# Patient Record
Sex: Female | Born: 1945 | Race: White | Hispanic: No | Smoking: Never smoker
Health system: Southern US, Community
[De-identification: ages and names within clinical notes are randomized; demographics above are authoritative.]

## PROBLEM LIST (undated history)

## (undated) DIAGNOSIS — F329 Major depressive disorder, single episode, unspecified: Secondary | ICD-10-CM

## (undated) DIAGNOSIS — E7800 Pure hypercholesterolemia, unspecified: Secondary | ICD-10-CM

## (undated) DIAGNOSIS — C4492 Squamous cell carcinoma of skin, unspecified: Secondary | ICD-10-CM

## (undated) DIAGNOSIS — G35 Multiple sclerosis: Secondary | ICD-10-CM

## (undated) DIAGNOSIS — F419 Anxiety disorder, unspecified: Secondary | ICD-10-CM

## (undated) DIAGNOSIS — G35D Multiple sclerosis, unspecified: Secondary | ICD-10-CM

## (undated) DIAGNOSIS — G2581 Restless legs syndrome: Secondary | ICD-10-CM

## (undated) DIAGNOSIS — F32A Depression, unspecified: Secondary | ICD-10-CM

## (undated) DIAGNOSIS — M159 Polyosteoarthritis, unspecified: Secondary | ICD-10-CM

## (undated) DIAGNOSIS — Z8619 Personal history of other infectious and parasitic diseases: Secondary | ICD-10-CM

## (undated) DIAGNOSIS — G5603 Carpal tunnel syndrome, bilateral upper limbs: Secondary | ICD-10-CM

## (undated) DIAGNOSIS — J329 Chronic sinusitis, unspecified: Secondary | ICD-10-CM

## (undated) DIAGNOSIS — M6740 Ganglion, unspecified site: Secondary | ICD-10-CM

## (undated) DIAGNOSIS — Z1211 Encounter for screening for malignant neoplasm of colon: Secondary | ICD-10-CM

## (undated) DIAGNOSIS — Z85828 Personal history of other malignant neoplasm of skin: Secondary | ICD-10-CM

## (undated) DIAGNOSIS — M4802 Spinal stenosis, cervical region: Secondary | ICD-10-CM

## (undated) DIAGNOSIS — I10 Essential (primary) hypertension: Secondary | ICD-10-CM

## (undated) DIAGNOSIS — Z8489 Family history of other specified conditions: Secondary | ICD-10-CM

## (undated) DIAGNOSIS — C4491 Basal cell carcinoma of skin, unspecified: Secondary | ICD-10-CM

## (undated) DIAGNOSIS — M1990 Unspecified osteoarthritis, unspecified site: Secondary | ICD-10-CM

## (undated) DIAGNOSIS — IMO0001 Reserved for inherently not codable concepts without codable children: Secondary | ICD-10-CM

## (undated) HISTORY — PX: EYE SURGERY: SHX253

## (undated) HISTORY — DX: Personal history of other malignant neoplasm of skin: Z85.828

## (undated) HISTORY — DX: Personal history of other infectious and parasitic diseases: Z86.19

## (undated) HISTORY — DX: Depression, unspecified: F32.A

## (undated) HISTORY — DX: Spinal stenosis, cervical region: M48.02

## (undated) HISTORY — DX: Ganglion, unspecified site: M67.40

## (undated) HISTORY — PX: TUBAL LIGATION: SHX77

## (undated) HISTORY — DX: Pure hypercholesterolemia, unspecified: E78.00

## (undated) HISTORY — PX: CATARACT EXTRACTION: SUR2

## (undated) HISTORY — PX: COLONOSCOPY: SHX174

## (undated) HISTORY — PX: RETINAL DETACHMENT SURGERY: SHX105

## (undated) HISTORY — DX: Encounter for screening for malignant neoplasm of colon: Z12.11

## (undated) HISTORY — DX: Chronic sinusitis, unspecified: J32.9

## (undated) HISTORY — DX: Carpal tunnel syndrome, bilateral upper limbs: G56.03

## (undated) HISTORY — DX: Anxiety disorder, unspecified: F41.9

## (undated) HISTORY — DX: Polyosteoarthritis, unspecified: M15.9

## (undated) HISTORY — PX: NASAL SINUS SURGERY: SHX719

---
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
## 1998-12-29 ENCOUNTER — Other Ambulatory Visit: Admission: RE | Admit: 1998-12-29 | Discharge: 1998-12-29 | Payer: Self-pay | Admitting: Internal Medicine

## 1999-11-17 ENCOUNTER — Other Ambulatory Visit: Admission: RE | Admit: 1999-11-17 | Discharge: 1999-11-17 | Payer: Self-pay | Admitting: Internal Medicine

## 1999-12-14 ENCOUNTER — Encounter: Payer: Self-pay | Admitting: Internal Medicine

## 1999-12-14 ENCOUNTER — Encounter: Admission: RE | Admit: 1999-12-14 | Discharge: 1999-12-14 | Payer: Self-pay | Admitting: Internal Medicine

## 1999-12-18 ENCOUNTER — Encounter: Admission: RE | Admit: 1999-12-18 | Discharge: 1999-12-18 | Payer: Self-pay | Admitting: Internal Medicine

## 1999-12-18 ENCOUNTER — Encounter: Payer: Self-pay | Admitting: Internal Medicine

## 2000-07-27 DIAGNOSIS — C4492 Squamous cell carcinoma of skin, unspecified: Secondary | ICD-10-CM

## 2000-07-27 HISTORY — DX: Squamous cell carcinoma of skin, unspecified: C44.92

## 2000-12-13 ENCOUNTER — Encounter: Admission: RE | Admit: 2000-12-13 | Discharge: 2000-12-13 | Payer: Self-pay | Admitting: Obstetrics and Gynecology

## 2000-12-13 ENCOUNTER — Encounter: Payer: Self-pay | Admitting: Obstetrics and Gynecology

## 2001-12-28 ENCOUNTER — Encounter: Payer: Self-pay | Admitting: Obstetrics and Gynecology

## 2001-12-28 ENCOUNTER — Encounter: Admission: RE | Admit: 2001-12-28 | Discharge: 2001-12-28 | Payer: Self-pay | Admitting: Obstetrics and Gynecology

## 2002-12-06 ENCOUNTER — Encounter: Payer: Self-pay | Admitting: Internal Medicine

## 2002-12-06 ENCOUNTER — Encounter: Admission: RE | Admit: 2002-12-06 | Discharge: 2002-12-06 | Payer: Self-pay | Admitting: Internal Medicine

## 2003-01-01 ENCOUNTER — Encounter: Admission: RE | Admit: 2003-01-01 | Discharge: 2003-01-01 | Payer: Self-pay | Admitting: Obstetrics and Gynecology

## 2003-01-01 ENCOUNTER — Encounter: Payer: Self-pay | Admitting: Obstetrics and Gynecology

## 2003-12-10 ENCOUNTER — Other Ambulatory Visit: Admission: RE | Admit: 2003-12-10 | Discharge: 2003-12-10 | Payer: Self-pay | Admitting: Internal Medicine

## 2004-01-20 ENCOUNTER — Encounter: Admission: RE | Admit: 2004-01-20 | Discharge: 2004-01-20 | Payer: Self-pay | Admitting: Internal Medicine

## 2005-03-11 ENCOUNTER — Ambulatory Visit (HOSPITAL_COMMUNITY): Admission: RE | Admit: 2005-03-11 | Discharge: 2005-03-11 | Payer: Self-pay | Admitting: Psychiatry

## 2005-03-11 ENCOUNTER — Encounter: Admission: RE | Admit: 2005-03-11 | Discharge: 2005-03-11 | Payer: Self-pay | Admitting: Internal Medicine

## 2005-03-11 ENCOUNTER — Other Ambulatory Visit: Admission: RE | Admit: 2005-03-11 | Discharge: 2005-03-11 | Payer: Self-pay | Admitting: Internal Medicine

## 2006-01-06 ENCOUNTER — Encounter: Payer: Self-pay | Admitting: Gastroenterology

## 2006-01-06 ENCOUNTER — Ambulatory Visit: Payer: Self-pay | Admitting: Family Medicine

## 2006-02-22 ENCOUNTER — Ambulatory Visit: Payer: Self-pay | Admitting: Family Medicine

## 2006-10-12 ENCOUNTER — Encounter: Admission: RE | Admit: 2006-10-12 | Discharge: 2006-10-12 | Payer: Self-pay | Admitting: Family Medicine

## 2006-10-27 ENCOUNTER — Encounter: Admission: RE | Admit: 2006-10-27 | Discharge: 2006-10-27 | Payer: Self-pay | Admitting: Unknown Physician Specialty

## 2006-11-03 ENCOUNTER — Ambulatory Visit: Payer: Self-pay | Admitting: Family Medicine

## 2006-12-15 ENCOUNTER — Ambulatory Visit: Payer: Self-pay | Admitting: Family Medicine

## 2007-04-28 ENCOUNTER — Encounter: Admission: RE | Admit: 2007-04-28 | Discharge: 2007-04-28 | Payer: Self-pay | Admitting: Family Medicine

## 2008-04-08 DIAGNOSIS — C4491 Basal cell carcinoma of skin, unspecified: Secondary | ICD-10-CM

## 2008-04-08 HISTORY — DX: Basal cell carcinoma of skin, unspecified: C44.91

## 2008-05-13 ENCOUNTER — Encounter: Admission: RE | Admit: 2008-05-13 | Discharge: 2008-05-13 | Payer: Self-pay | Admitting: Unknown Physician Specialty

## 2009-06-03 ENCOUNTER — Encounter: Admission: RE | Admit: 2009-06-03 | Discharge: 2009-06-03 | Payer: Self-pay | Admitting: Unknown Physician Specialty

## 2010-05-26 ENCOUNTER — Encounter: Payer: Self-pay | Admitting: Gastroenterology

## 2010-05-28 ENCOUNTER — Encounter: Payer: Self-pay | Admitting: Gastroenterology

## 2010-06-10 ENCOUNTER — Encounter (INDEPENDENT_AMBULATORY_CARE_PROVIDER_SITE_OTHER): Payer: Self-pay | Admitting: *Deleted

## 2010-06-15 ENCOUNTER — Ambulatory Visit: Payer: Self-pay | Admitting: Gastroenterology

## 2010-06-22 ENCOUNTER — Telehealth (INDEPENDENT_AMBULATORY_CARE_PROVIDER_SITE_OTHER): Payer: Self-pay | Admitting: *Deleted

## 2010-06-29 ENCOUNTER — Ambulatory Visit: Payer: Self-pay | Admitting: Gastroenterology

## 2010-09-05 ENCOUNTER — Encounter: Payer: Self-pay | Admitting: Family Medicine

## 2010-09-06 ENCOUNTER — Encounter: Payer: Self-pay | Admitting: Otolaryngology

## 2010-09-15 NOTE — Letter (Signed)
[facility]: [facility]   Imported By: Lennie Odor 07/03/2010 16:07:54  _____________________________________________________________________  External Attachment:    Type:   Image     Comment:   External Document

## 2010-09-15 NOTE — Letter (Signed)
Summary: Patient Medical HX / [facility]  Patient Medical HX / [facility]   Imported By: Lennie Odor 07/03/2010 16:11:54  _____________________________________________________________________  External Attachment:    Type:   Image     Comment:   External Document

## 2010-09-15 NOTE — Letter (Signed)
Summary: Pre Visit Letter Revised  [facility]  [address] [place], [place] [address]   Phone: [phone]  Fax: [phone]        05/28/2010 MRN: [id number]  Tracey Snow [address] [place], [place]  [address]             Procedure Date:  11-14 2:30pm  Welcome to the Gastroenterology Division at [facility].    You are scheduled to see a nurse for your pre-procedure visit on 06-15-10 at 1:30pm on the 3rd floor at [facility], [address]  We ask that you try to arrive at our office 15 minutes prior to your appointment time to allow for check-in.  Please take a minute to review the attached form.  If you answer "Yes" to one or more of the questions on the first page, we ask that you call the person listed at your earliest opportunity.  If you answer "No" to all of the questions, please complete the rest of the form and bring it to your appointment.    Your nurse visit will consist of discussing your medical and surgical history, your immediate family medical history, and your medications.   If you are unable to list all of your medications on the form, please bring the medication bottles to your appointment and we will list them.  We will need to be aware of both prescribed and over the counter drugs.  We will need to know exact dosage information as well.    Please be prepared to read and sign documents such as consent forms, a financial agreement, and acknowledgement forms.  If necessary, and with your consent, a friend or relative is welcome to sit-in on the nurse visit with you.  Please bring your insurance card so that we may make a copy of it.  If your insurance requires a referral to see a specialist, please bring your referral form from your primary care physician.  No co-pay is required for this nurse visit.     If you cannot keep your appointment, please call [phone] to cancel or reschedule prior to your appointment date.  This  allows [place] the opportunity to schedule an appointment for another patient in need of care.    Thank you for choosing [facility] for your medical needs.  We appreciate the opportunity to care for you.  Please visit [place] at our website  to learn more about our practice.  Sincerely, The Gastroenterology Division

## 2010-09-15 NOTE — Progress Notes (Signed)
Summary: Prep meds called in to wrong RX  Phone Note Call from Patient Call back at Home Phone [phone]   Call For: Dr Jarold Motto [facility] [place] [facility] [address] in [place], [place] - its not in [place], [place]. So she does not have her prep meds yet!  Follow-up for Phone Call        [facility] [place], [place]. Pt notified. Follow-up by: Ezra Sites RN,  June 22, 2010 9:36 AM     Appended Document: Change pharmacy    Clinical Lists Changes

## 2010-09-15 NOTE — Procedures (Signed)
Summary: Colonoscopy  Patient: [name] Note: All result statuses are Final unless otherwise noted.  Tests: (1) Colonoscopy (COL)   COL Colonoscopy           DONE     [facility]     [address]     [place], [place]  [address]           COLONOSCOPY PROCEDURE REPORT           PATIENT:  Tracey, Snow  MR#:  [id number]     BIRTHDATE:  27-Jan-1946, 64 yrs. old  GENDER:  female     ENDOSCOPIST:  Vania Rea. Jarold Motto, MD, [facility]     REF. BY:  Joette Catching, M.D.     PROCEDURE DATE:  06/29/2010     PROCEDURE:  Average-risk screening colonoscopy     G0121     ASA CLASS:  Class II     INDICATIONS:  Routine Risk Screening     MEDICATIONS:   Fentanyl 100 mcg IV, Versed 10 mg IV, Benadryl 25     mg IV           DESCRIPTION OF PROCEDURE:   After the risks benefits and     alternatives of the procedure were thoroughly explained, informed     consent was obtained.  Digital rectal exam was performed and     revealed no abnormalities.   The LB CF-[id number] endoscope     was introduced through the anus and advanced to the cecum, which     was identified by both the appendix and ileocecal valve, limited     by a tortuous and redundant colon.    The quality of the prep was     excellent, using MoviPrep.  The instrument was then slowly     withdrawn as the colon was fully examined.     <<PROCEDUREIMAGES>>           FINDINGS:  No polyps or cancers were seen.  This was otherwise a     normal examination of the colon.   Retroflexed views in the rectum     revealed no abnormalities.    The scope was then withdrawn from     the patient and the procedure completed.           COMPLICATIONS:  None     ENDOSCOPIC IMPRESSION:     1) No polyps or cancers     2) Otherwise normal examination     RECOMMENDATIONS:     1) Continue current colorectal screening recommendations for     "routine risk" patients with a repeat colonoscopy in 10 years.     REPEAT EXAM:  No        ______________________________     Vania Rea. Jarold Motto, MD, [facility]           CC:           n.     eSIGNED:   Vania Rea. Troy Kanouse at 06/29/2010 03:19 PM           Annye Asa, [id number]  Note: An exclamation mark (!) indicates a result that was not dispersed into the flowsheet. Document Creation Date: 06/29/2010 3:20 PM _______________________________________________________________________  (1) Order result status: Final Collection or observation date-time: 06/29/2010 15:14 Requested date-time:  Receipt date-time:  Reported date-time:  Referring Physician:   Ordering Physician: Sheryn Bison [phone]) Specimen Source:  Source: Launa Grill Order Number: [id number] Lab site:   Appended Document: Colonoscopy  Clinical Lists Changes  Observations: Added new observation of COLONNXTDUE: 06/2020 (06/29/2010 16:21)

## 2010-09-15 NOTE — Letter (Signed)
Summary: [facility] Instructions  [facility]  [address] [place], [place] [address]   Phone: [phone]  Fax: [phone]       DEJIA EBRON    [birth year]    MRN: [id number]        Procedure Day Dorna Bloom:  Duanne Limerick  06/29/10     Arrival Time:  1:30PM     Procedure Time:  2:30PM     Location of Procedure:                    _X _  [facility] (4th Floor)  PREPARATION FOR COLONOSCOPY WITH MOVIPREP   Starting 5 days prior to your procedure 06/24/10 do not eat nuts, seeds, popcorn, corn, beans, peas,  salads, or any raw vegetables.  Do not take any fiber supplements (e.g. Metamucil, Citrucel, and Benefiber).  THE DAY BEFORE YOUR PROCEDURE         DATE: 06/28/10  DAY: SUNDAY  1.  Drink clear liquids the entire day-NO SOLID FOOD  2.  Do not drink anything colored red or purple.  Avoid juices with pulp.  No orange juice.  3.  Drink at least 64 oz. (8 glasses) of fluid/clear liquids during the day to prevent dehydration and help the prep work efficiently.  CLEAR LIQUIDS INCLUDE: Water Jello Ice Popsicles Tea (sugar ok, no milk/cream) Powdered fruit flavored drinks Coffee (sugar ok, no milk/cream) Gatorade Juice: apple, white grape, white cranberry  Lemonade Clear bullion, consomm, broth Carbonated beverages (any kind) Strained chicken noodle soup Hard Candy                             4.  In the morning, mix first dose of MoviPrep solution:    Empty 1 Pouch A and 1 Pouch B into the disposable container    Add lukewarm drinking water to the top line of the container. Mix to dissolve    Refrigerate (mixed solution should be used within 24 hrs)  5.  Begin drinking the prep at 5:00 p.m. The MoviPrep container is divided by 4 marks.   Every 15 minutes drink the solution down to the next mark (approximately 8 oz) until the full liter is complete.   6.  Follow completed prep with 16 oz of clear liquid of your choice (Nothing red or purple).   Continue to drink clear liquids until bedtime.  7.  Before going to bed, mix second dose of MoviPrep solution:    Empty 1 Pouch A and 1 Pouch B into the disposable container    Add lukewarm drinking water to the top line of the container. Mix to dissolve    Refrigerate  THE DAY OF YOUR PROCEDURE      DATE: 06/29/10  DAY: MONDAY  Beginning at 9:30AM (5 hours before procedure):         1. Every 15 minutes, drink the solution down to the next mark (approx 8 oz) until the full liter is complete.  2. Follow completed prep with 16 oz. of clear liquid of your choice.    3. You may drink clear liquids until 12:30PM (2 HOURS BEFORE PROCEDURE).   MEDICATION INSTRUCTIONS  Unless otherwise instructed, you should take regular prescription medications with a small sip of water   as early as possible the morning of your procedure.   Additional medication instructions:   Hold HCTZ the morning of procedure.  OTHER INSTRUCTIONS  You will need a responsible adult at least 64 years of age to accompany you and drive you home.   This person must remain in the waiting room during your procedure.  Wear loose fitting clothing that is easily removed.  Leave jewelry and other valuables at home.  However, you may wish to bring a book to read or  an iPod/MP3 player to listen to music as you wait for your procedure to start.  Remove all body piercing jewelry and leave at home.  Total time from sign-in until discharge is approximately 2-3 hours.  You should go home directly after your procedure and rest.  You can resume normal activities the  day after your procedure.  The day of your procedure you should not:   Drive   Make legal decisions   Operate machinery   Drink alcohol   Return to work  You will receive specific instructions about eating, activities and medications before you leave.    The above instructions have been reviewed and explained to me by   Wyona Almas  RN  June 15, 2010 2:06 PM     I fully understand and can verbalize these instructions _____________________________ Date _________

## 2010-09-15 NOTE — Miscellaneous (Signed)
Summary: [facility] Previsit/prep  Clinical Lists Changes  Medications: Added new medication of MOVIPREP 100 GM  SOLR (PEG-KCL-NACL-NASULF-NA ASC-C) As per prep instructions. - Signed Rx of MOVIPREP 100 GM  SOLR (PEG-KCL-NACL-NASULF-NA ASC-C) As per prep instructions.;  #1 x 0;  Signed;  Entered by: Wyona Almas RN;  Authorized by: Mardella Layman MD [facility];  Method used: [facility] to [facility] [address] [place], [place]  [address], Ph: [phone] or [phone], Fax: [phone] Allergies: Added new allergy or adverse reaction of SULFA Observations: Added new observation of NKA: F (06/15/2010 13:11)    Prescriptions: MOVIPREP 100 GM  SOLR (PEG-KCL-NACL-NASULF-NA ASC-C) As per prep instructions.  #1 x 0   Entered by:   Wyona Almas RN   Authorized by:   Mardella Layman MD [facility]   Signed by:   Wyona Almas RN on 06/15/2010   Method used:   [facility]ronically to        [id number] (retail)       [address] [place]       [place], [place]  [address]       Ph: [phone] or [phone]       Fax: [phone]   RxID:   [id number]

## 2013-05-07 ENCOUNTER — Other Ambulatory Visit: Payer: Self-pay

## 2013-05-07 DIAGNOSIS — Z1231 Encounter for screening mammogram for malignant neoplasm of breast: Secondary | ICD-10-CM

## 2013-05-29 ENCOUNTER — Ambulatory Visit
Admission: RE | Admit: 2013-05-29 | Discharge: 2013-05-29 | Disposition: A | Discharge status: Home / Self Care | Payer: Medicare Other | Source: Ambulatory Visit

## 2013-05-29 DIAGNOSIS — Z1231 Encounter for screening mammogram for malignant neoplasm of breast: Secondary | ICD-10-CM

## 2014-01-18 ENCOUNTER — Other Ambulatory Visit: Payer: Self-pay

## 2014-01-18 DIAGNOSIS — Z1231 Encounter for screening mammogram for malignant neoplasm of breast: Secondary | ICD-10-CM

## 2015-02-18 ENCOUNTER — Encounter: Payer: Self-pay | Admitting: Gastroenterology

## 2016-03-25 ENCOUNTER — Encounter (INDEPENDENT_AMBULATORY_CARE_PROVIDER_SITE_OTHER): Payer: Medicare HMO | Admitting: Ophthalmology

## 2016-03-25 DIAGNOSIS — H2513 Age-related nuclear cataract, bilateral: Secondary | ICD-10-CM | POA: Diagnosis not present

## 2016-03-25 DIAGNOSIS — I10 Essential (primary) hypertension: Secondary | ICD-10-CM | POA: Diagnosis not present

## 2016-03-25 DIAGNOSIS — H35342 Macular cyst, hole, or pseudohole, left eye: Secondary | ICD-10-CM

## 2016-03-25 DIAGNOSIS — H43813 Vitreous degeneration, bilateral: Secondary | ICD-10-CM | POA: Diagnosis not present

## 2016-03-25 DIAGNOSIS — H35033 Hypertensive retinopathy, bilateral: Secondary | ICD-10-CM

## 2016-03-26 NOTE — H&P (Signed)
Tracey Snow is an 70 y.o. female.   Chief Complaint:loss of vision over 6-12 months left eye HPI: Noted painless loss of vision left eye  No past medical history on file.  No past surgical history on file.  No family history on file. Social History:  has no tobacco, alcohol, and drug history on file.  Allergies:  Allergies  Allergen Reactions  . Sulfonamide Derivatives     REACTION: rash, difficulty swallowing    No prescriptions prior to admission.    Review of systems otherwise negative  There were no vitals taken for this visit.  Physical exam: Mental status: oriented x3. Eyes: See eye exam associated with this date of surgery in media tab.  Scanned in by scanning center Ears, Nose, Throat: within normal limits Neck: Within Normal limits General: within normal limits Chest: Within normal limits Breast: deferred Heart: Within normal limits Abdomen: Within normal limits GU: deferred Extremities: within normal limits Skin: within normal limits  Assessment/Plan Macular hole left eye Plan: To [facility] for Pars plana vitrectomy, laser, membrane peel, serum patch, gas injection left eye  Nasser Ku, Chrystie Nose 03/26/2016, 7:36 AM

## 2016-04-05 ENCOUNTER — Encounter (HOSPITAL_COMMUNITY): Payer: Self-pay | Admitting: *Deleted

## 2016-04-05 MED ORDER — CEFAZOLIN SODIUM-DEXTROSE 2-4 GM/100ML-% IV SOLN
2.0000 g | INTRAVENOUS | Status: AC
  Administered 2016-04-06: 2 g via INTRAVENOUS
  Filled 2016-04-05: qty 100

## 2016-04-05 NOTE — Progress Notes (Signed)
Spoke with pt for pre-op call. Pt denies cardiac history or chest pain. She states she will have exertional sob due to her MS.

## 2016-04-06 ENCOUNTER — Encounter (HOSPITAL_COMMUNITY)
Admission: RE | Disposition: A | Discharge status: Home / Self Care | Payer: Self-pay | Source: Ambulatory Visit | Attending: Ophthalmology

## 2016-04-06 ENCOUNTER — Ambulatory Visit (HOSPITAL_COMMUNITY)
Admission: RE | Admit: 2016-04-06 | Discharge: 2016-04-07 | Disposition: A | Discharge status: Home / Self Care | Payer: Medicare HMO | Source: Ambulatory Visit | Attending: Ophthalmology | Admitting: Ophthalmology

## 2016-04-06 ENCOUNTER — Ambulatory Visit (HOSPITAL_COMMUNITY): Payer: Medicare HMO | Admitting: Anesthesiology

## 2016-04-06 ENCOUNTER — Encounter (HOSPITAL_COMMUNITY): Payer: Self-pay | Admitting: Certified Registered Nurse Anesthetist

## 2016-04-06 DIAGNOSIS — F418 Other specified anxiety disorders: Secondary | ICD-10-CM | POA: Diagnosis not present

## 2016-04-06 DIAGNOSIS — I10 Essential (primary) hypertension: Secondary | ICD-10-CM | POA: Insufficient documentation

## 2016-04-06 DIAGNOSIS — Z79899 Other long term (current) drug therapy: Secondary | ICD-10-CM | POA: Insufficient documentation

## 2016-04-06 DIAGNOSIS — H35342 Macular cyst, hole, or pseudohole, left eye: Secondary | ICD-10-CM | POA: Diagnosis present

## 2016-04-06 DIAGNOSIS — G35 Multiple sclerosis: Secondary | ICD-10-CM | POA: Diagnosis not present

## 2016-04-06 DIAGNOSIS — Z79890 Hormone replacement therapy (postmenopausal): Secondary | ICD-10-CM | POA: Insufficient documentation

## 2016-04-06 HISTORY — PX: 25 GAUGE PARS PLANA VITRECTOMY WITH 20 GAUGE MVR PORT FOR MACULAR HOLE: SHX6096

## 2016-04-06 HISTORY — DX: Essential (primary) hypertension: I10

## 2016-04-06 HISTORY — DX: Family history of other specified conditions: Z84.89

## 2016-04-06 HISTORY — PX: MEMBRANE PEEL: SHX5967

## 2016-04-06 HISTORY — DX: Major depressive disorder, single episode, unspecified: F32.9

## 2016-04-06 HISTORY — DX: Unspecified osteoarthritis, unspecified site: M19.90

## 2016-04-06 HISTORY — DX: Pure hypercholesterolemia, unspecified: E78.00

## 2016-04-06 HISTORY — DX: Multiple sclerosis: G35

## 2016-04-06 HISTORY — PX: SERUM PATCH: SHX6091

## 2016-04-06 HISTORY — DX: Anxiety disorder, unspecified: F41.9

## 2016-04-06 HISTORY — DX: Multiple sclerosis, unspecified: G35.D

## 2016-04-06 HISTORY — DX: Depression, unspecified: F32.A

## 2016-04-06 HISTORY — DX: Reserved for inherently not codable concepts without codable children: IMO0001

## 2016-04-06 HISTORY — PX: GAS/FLUID EXCHANGE: SHX5334

## 2016-04-06 HISTORY — PX: PARS PLANA VITRECTOMY W/ REPAIR OF MACULAR HOLE: SHX2170

## 2016-04-06 HISTORY — DX: Macular cyst, hole, or pseudohole, left eye: H35.342

## 2016-04-06 HISTORY — PX: LASER PHOTO ABLATION: SHX5942

## 2016-04-06 HISTORY — DX: Restless legs syndrome: G25.81

## 2016-04-06 LAB — BASIC METABOLIC PANEL
ANION GAP: 6 (ref 5–15)
BUN: 12 mg/dL (ref 6–20)
CHLORIDE: 107 mmol/L (ref 101–111)
CO2: 26 mmol/L (ref 22–32)
Calcium: 9.2 mg/dL (ref 8.9–10.3)
Creatinine, Ser: 0.94 mg/dL (ref 0.44–1.00)
GFR calc non Af Amer: >60 mL/min (ref >60)
Glucose, Bld: 95 mg/dL (ref 65–99)
POTASSIUM: 3.4 mmol/L — AB (ref 3.5–5.1)
Sodium: 139 mmol/L (ref 135–145)

## 2016-04-06 LAB — CBC
HCT: 42.1 % (ref 36.0–46.0)
HEMOGLOBIN: 13.9 g/dL (ref 12.0–15.0)
MCH: 31.3 pg (ref 26.0–34.0)
MCHC: 33 g/dL (ref 30.0–36.0)
MCV: 94.8 fL (ref 78.0–100.0)
Platelets: 225 10*3/uL (ref 150–400)
RBC: 4.44 MIL/uL (ref 3.87–5.11)
RDW: 13 % (ref 11.5–15.5)
WBC: 5.8 10*3/uL (ref 4.0–10.5)

## 2016-04-06 LAB — AUTOLOGOUS SERUM PATCH PREP

## 2016-04-06 SURGERY — 25 GAUGE PARS PLANA VITRECTOMY WITH 20 GAUGE MVR PORT FOR MACULAR HOLE
Anesthesia: General | Site: Eye | Laterality: Left

## 2016-04-06 MED ORDER — BUPIVACAINE HCL (PF) 0.75 % IJ SOLN
INTRAMUSCULAR | Status: DC | PRN
  Administered 2016-04-06: 10 mL

## 2016-04-06 MED ORDER — ONDANSETRON HCL 4 MG/2ML IJ SOLN
4.0000 mg | Freq: Four times a day (QID) | INTRAMUSCULAR | Status: DC
  Administered 2016-04-06 – 2016-04-07 (×2): 4 mg via INTRAVENOUS
  Filled 2016-04-06 (×2): qty 2

## 2016-04-06 MED ORDER — PHENYLEPHRINE HCL 2.5 % OP SOLN
1.0000 [drp] | OPHTHALMIC | Status: AC
  Administered 2016-04-06 (×3): 1 [drp] via OPHTHALMIC
  Filled 2016-04-06: qty 2

## 2016-04-06 MED ORDER — ACETAMINOPHEN 325 MG PO TABS: 325.0000 mg | ORAL_TABLET | ORAL | Status: DC | PRN

## 2016-04-06 MED ORDER — BUPIVACAINE-EPINEPHRINE (PF) 0.25% -1:200000 IJ SOLN
INTRAMUSCULAR | Status: AC
  Filled 2016-04-06: qty 30

## 2016-04-06 MED ORDER — DEXAMETHASONE SODIUM PHOSPHATE 10 MG/ML IJ SOLN
INTRAMUSCULAR | Status: AC
  Filled 2016-04-06: qty 1

## 2016-04-06 MED ORDER — SODIUM HYALURONATE 10 MG/ML IO SOLN
INTRAOCULAR | Status: AC
  Filled 2016-04-06: qty 0.85

## 2016-04-06 MED ORDER — ROCURONIUM BROMIDE 100 MG/10ML IV SOLN
INTRAVENOUS | Status: DC | PRN
  Administered 2016-04-06: 40 mg via INTRAVENOUS

## 2016-04-06 MED ORDER — PROMETHAZINE HCL 25 MG/ML IJ SOLN: 6.2500 mg | INTRAMUSCULAR | Status: DC | PRN

## 2016-04-06 MED ORDER — TETRACAINE HCL 0.5 % OP SOLN
2.0000 [drp] | Freq: Once | OPHTHALMIC | Status: DC
  Filled 2016-04-06: qty 2

## 2016-04-06 MED ORDER — GATIFLOXACIN 0.5 % OP SOLN
1.0000 [drp] | Freq: Four times a day (QID) | OPHTHALMIC | Status: DC
  Filled 2016-04-06: qty 2.5

## 2016-04-06 MED ORDER — PREDNISOLONE ACETATE 1 % OP SUSP
1.0000 [drp] | Freq: Four times a day (QID) | OPHTHALMIC | Status: DC
  Filled 2016-04-06: qty 5

## 2016-04-06 MED ORDER — ROCURONIUM BROMIDE 10 MG/ML (PF) SYRINGE
PREFILLED_SYRINGE | INTRAVENOUS | Status: AC
  Filled 2016-04-06: qty 10

## 2016-04-06 MED ORDER — FENTANYL CITRATE (PF) 100 MCG/2ML IJ SOLN
INTRAMUSCULAR | Status: AC
  Filled 2016-04-06: qty 2

## 2016-04-06 MED ORDER — TRIAMCINOLONE ACETONIDE 40 MG/ML IJ SUSP
INTRAMUSCULAR | Status: AC
  Filled 2016-04-06: qty 5

## 2016-04-06 MED ORDER — BRIMONIDINE TARTRATE 0.2 % OP SOLN
1.0000 [drp] | Freq: Two times a day (BID) | OPHTHALMIC | Status: DC
  Filled 2016-04-06: qty 5

## 2016-04-06 MED ORDER — HYPROMELLOSE (GONIOSCOPIC) 2.5 % OP SOLN
OPHTHALMIC | Status: AC
  Filled 2016-04-06: qty 15

## 2016-04-06 MED ORDER — EPHEDRINE 5 MG/ML INJ
INTRAVENOUS | Status: AC
  Filled 2016-04-06: qty 10

## 2016-04-06 MED ORDER — PHENYLEPHRINE HCL 10 MG/ML IJ SOLN
INTRAMUSCULAR | Status: DC | PRN
  Administered 2016-04-06 (×2): 80 ug via INTRAVENOUS

## 2016-04-06 MED ORDER — HYALURONIDASE HUMAN 150 UNIT/ML IJ SOLN
INTRAMUSCULAR | Status: AC
  Filled 2016-04-06: qty 1

## 2016-04-06 MED ORDER — AMLODIPINE BESYLATE 5 MG PO TABS
5.0000 mg | ORAL_TABLET | Freq: Every day | ORAL | Status: DC
  Administered 2016-04-06: 5 mg via ORAL
  Filled 2016-04-06: qty 1

## 2016-04-06 MED ORDER — SODIUM HYALURONATE 10 MG/ML IO SOLN
INTRAOCULAR | Status: DC | PRN
  Administered 2016-04-06: 0.85 mL via INTRAOCULAR

## 2016-04-06 MED ORDER — TROPICAMIDE 1 % OP SOLN
1.0000 [drp] | OPHTHALMIC | Status: AC
  Administered 2016-04-06 (×3): 1 [drp] via OPHTHALMIC
  Filled 2016-04-06: qty 3

## 2016-04-06 MED ORDER — SUGAMMADEX SODIUM 200 MG/2ML IV SOLN
INTRAVENOUS | Status: DC | PRN
  Administered 2016-04-06: 200 mg via INTRAVENOUS

## 2016-04-06 MED ORDER — DEXAMETHASONE SODIUM PHOSPHATE 4 MG/ML IJ SOLN
INTRAMUSCULAR | Status: DC | PRN
  Administered 2016-04-06: 10 mg via INTRAVENOUS

## 2016-04-06 MED ORDER — BSS PLUS IO SOLN
INTRAOCULAR | Status: DC | PRN
  Administered 2016-04-06: 1

## 2016-04-06 MED ORDER — MAGNESIUM HYDROXIDE 400 MG/5ML PO SUSP
15.0000 mL | Freq: Four times a day (QID) | ORAL | Status: DC | PRN
Start: 2016-04-06 — End: 2016-04-07

## 2016-04-06 MED ORDER — ACETAZOLAMIDE SODIUM 500 MG IJ SOLR
500.0000 mg | Freq: Once | INTRAMUSCULAR | Status: AC
  Administered 2016-04-07: 500 mg via INTRAVENOUS
  Filled 2016-04-06: qty 500

## 2016-04-06 MED ORDER — TEMAZEPAM 15 MG PO CAPS: 15.0000 mg | ORAL_CAPSULE | Freq: Every evening | ORAL | Status: DC | PRN

## 2016-04-06 MED ORDER — BACITRACIN-POLYMYXIN B 500-10000 UNIT/GM OP OINT
TOPICAL_OINTMENT | OPHTHALMIC | Status: DC | PRN
  Administered 2016-04-06: 1 via OPHTHALMIC

## 2016-04-06 MED ORDER — FENTANYL CITRATE (PF) 100 MCG/2ML IJ SOLN
INTRAMUSCULAR | Status: AC
  Filled 2016-04-06: qty 4

## 2016-04-06 MED ORDER — CYCLOPENTOLATE HCL 1 % OP SOLN
1.0000 [drp] | OPHTHALMIC | Status: AC
  Administered 2016-04-06 (×3): 1 [drp] via OPHTHALMIC
  Filled 2016-04-06: qty 2

## 2016-04-06 MED ORDER — ROCURONIUM BROMIDE 10 MG/ML (PF) SYRINGE
PREFILLED_SYRINGE | INTRAVENOUS | Status: AC
Start: 2016-04-06 — End: 2016-04-06
  Filled 2016-04-06: qty 10

## 2016-04-06 MED ORDER — MIDAZOLAM HCL 2 MG/2ML IJ SOLN
INTRAMUSCULAR | Status: AC
  Filled 2016-04-06: qty 2

## 2016-04-06 MED ORDER — ONDANSETRON HCL 4 MG/2ML IJ SOLN
INTRAMUSCULAR | Status: AC
  Filled 2016-04-06: qty 2

## 2016-04-06 MED ORDER — BACITRACIN-POLYMYXIN B 500-10000 UNIT/GM OP OINT
1.0000 "application " | TOPICAL_OINTMENT | Freq: Three times a day (TID) | OPHTHALMIC | Status: DC
  Filled 2016-04-06: qty 3.5

## 2016-04-06 MED ORDER — POLYMYXIN B SULFATE 500000 UNITS IJ SOLR
INTRAMUSCULAR | Status: AC
  Filled 2016-04-06: qty 1

## 2016-04-06 MED ORDER — ACETAMINOPHEN 325 MG PO TABS
650.0000 mg | ORAL_TABLET | Freq: Four times a day (QID) | ORAL | Status: DC | PRN
Start: 2016-04-06 — End: 2016-04-06

## 2016-04-06 MED ORDER — SODIUM CHLORIDE 0.9 % IV SOLN
INTRAVENOUS | Status: DC
  Administered 2016-04-06 (×3): via INTRAVENOUS

## 2016-04-06 MED ORDER — HYDROCODONE-ACETAMINOPHEN 7.5-325 MG PO TABS: 1.0000 | ORAL_TABLET | Freq: Once | ORAL | Status: DC | PRN

## 2016-04-06 MED ORDER — MEDROXYPROGESTERONE ACETATE 2.5 MG PO TABS
2.5000 mg | ORAL_TABLET | Freq: Every day | ORAL | Status: DC
  Filled 2016-04-06: qty 1

## 2016-04-06 MED ORDER — MORPHINE SULFATE (PF) 2 MG/ML IV SOLN: 1.0000 mg | INTRAVENOUS | Status: DC | PRN

## 2016-04-06 MED ORDER — ESTRADIOL 1 MG PO TABS
1.0000 mg | ORAL_TABLET | Freq: Every day | ORAL | Status: DC
Start: 2016-04-07 — End: 2016-04-07

## 2016-04-06 MED ORDER — LIDOCAINE 2% (20 MG/ML) 5 ML SYRINGE
INTRAMUSCULAR | Status: AC
  Filled 2016-04-06: qty 5

## 2016-04-06 MED ORDER — LISINOPRIL 20 MG PO TABS
20.0000 mg | ORAL_TABLET | Freq: Every day | ORAL | Status: DC
  Administered 2016-04-06: 20 mg via ORAL
  Filled 2016-04-06: qty 1

## 2016-04-06 MED ORDER — LATANOPROST 0.005 % OP SOLN
1.0000 [drp] | Freq: Every day | OPHTHALMIC | Status: DC
  Filled 2016-04-06: qty 2.5

## 2016-04-06 MED ORDER — CEFTAZIDIME 1 G IJ SOLR
INTRAMUSCULAR | Status: AC
  Filled 2016-04-06: qty 1

## 2016-04-06 MED ORDER — TIZANIDINE HCL 4 MG PO TABS
4.0000 mg | ORAL_TABLET | Freq: Every day | ORAL | Status: DC
  Filled 2016-04-06: qty 1
  Filled 2016-04-06: qty 2

## 2016-04-06 MED ORDER — ALPRAZOLAM 0.5 MG PO TABS
1.0000 mg | ORAL_TABLET | Freq: Every evening | ORAL | Status: DC | PRN
  Administered 2016-04-07: 1 mg via ORAL
  Filled 2016-04-06: qty 2

## 2016-04-06 MED ORDER — BSS IO SOLN
INTRAOCULAR | Status: AC
  Filled 2016-04-06: qty 15

## 2016-04-06 MED ORDER — ATROPINE SULFATE 1 % OP SOLN
OPHTHALMIC | Status: DC | PRN
  Administered 2016-04-06: 1 [drp] via OPHTHALMIC

## 2016-04-06 MED ORDER — LIDOCAINE HCL (CARDIAC) 20 MG/ML IV SOLN
INTRAVENOUS | Status: DC | PRN
  Administered 2016-04-06: 100 mg via INTRAVENOUS

## 2016-04-06 MED ORDER — BUPIVACAINE HCL (PF) 0.75 % IJ SOLN
INTRAMUSCULAR | Status: AC
  Filled 2016-04-06: qty 10

## 2016-04-06 MED ORDER — FENTANYL CITRATE (PF) 100 MCG/2ML IJ SOLN
INTRAMUSCULAR | Status: DC | PRN
  Administered 2016-04-06 (×2): 50 ug via INTRAVENOUS

## 2016-04-06 MED ORDER — LIDOCAINE HCL 2 % IJ SOLN
INTRAMUSCULAR | Status: AC
  Filled 2016-04-06: qty 20

## 2016-04-06 MED ORDER — EPINEPHRINE HCL 1 MG/ML IJ SOLN
INTRAMUSCULAR | Status: AC
  Filled 2016-04-06: qty 1

## 2016-04-06 MED ORDER — CITALOPRAM HYDROBROMIDE 20 MG PO TABS: 20.0000 mg | ORAL_TABLET | Freq: Every day | ORAL | Status: DC

## 2016-04-06 MED ORDER — BSS PLUS IO SOLN
INTRAOCULAR | Status: AC
  Filled 2016-04-06: qty 500

## 2016-04-06 MED ORDER — GATIFLOXACIN 0.5 % OP SOLN
1.0000 [drp] | OPHTHALMIC | Status: AC
  Administered 2016-04-06 (×3): 1 [drp] via OPHTHALMIC
  Filled 2016-04-06: qty 2.5

## 2016-04-06 MED ORDER — BSS IO SOLN
INTRAOCULAR | Status: DC | PRN
  Administered 2016-04-06: 15 mL via INTRAOCULAR

## 2016-04-06 MED ORDER — EPHEDRINE SULFATE 50 MG/ML IJ SOLN
INTRAMUSCULAR | Status: DC | PRN
  Administered 2016-04-06 (×2): 10 mg via INTRAVENOUS

## 2016-04-06 MED ORDER — BACITRACIN-POLYMYXIN B 500-10000 UNIT/GM OP OINT
TOPICAL_OINTMENT | OPHTHALMIC | Status: AC
  Filled 2016-04-06: qty 3.5

## 2016-04-06 MED ORDER — ROSUVASTATIN CALCIUM 20 MG PO TABS
20.0000 mg | ORAL_TABLET | Freq: Every day | ORAL | Status: DC
  Administered 2016-04-06: 20 mg via ORAL
  Filled 2016-04-06: qty 1

## 2016-04-06 MED ORDER — ONDANSETRON HCL 4 MG/2ML IJ SOLN
INTRAMUSCULAR | Status: DC | PRN
  Administered 2016-04-06: 4 mg via INTRAVENOUS

## 2016-04-06 MED ORDER — ALPRAZOLAM 0.5 MG PO TABS
1.0000 mg | ORAL_TABLET | Freq: Two times a day (BID) | ORAL | Status: DC
  Administered 2016-04-06: 1 mg via ORAL
  Filled 2016-04-06: qty 2

## 2016-04-06 MED ORDER — EPINEPHRINE HCL 1 MG/ML IJ SOLN
INTRAMUSCULAR | Status: DC | PRN
  Administered 2016-04-06: .3 mL

## 2016-04-06 MED ORDER — DEXAMETHASONE SODIUM PHOSPHATE 10 MG/ML IJ SOLN
INTRAMUSCULAR | Status: DC | PRN
  Administered 2016-04-06: 10 mg

## 2016-04-06 MED ORDER — HYDROCODONE-ACETAMINOPHEN 5-325 MG PO TABS: 1.0000 | ORAL_TABLET | ORAL | Status: DC | PRN

## 2016-04-06 MED ORDER — STERILE WATER FOR INJECTION IJ SOLN
INTRAMUSCULAR | Status: DC | PRN
  Administered 2016-04-06: 20 mL

## 2016-04-06 MED ORDER — STERILE WATER FOR INJECTION IJ SOLN
INTRAMUSCULAR | Status: AC
  Filled 2016-04-06: qty 20

## 2016-04-06 MED ORDER — PROPOFOL 10 MG/ML IV BOLUS
INTRAVENOUS | Status: AC
  Filled 2016-04-06: qty 20

## 2016-04-06 MED ORDER — PROPOFOL 10 MG/ML IV BOLUS
INTRAVENOUS | Status: DC | PRN
  Administered 2016-04-06: 30 mg via INTRAVENOUS
  Administered 2016-04-06: 120 mg via INTRAVENOUS

## 2016-04-06 MED ORDER — POTASSIUM CHLORIDE 20 MEQ PO PACK
40.0000 meq | PACK | Freq: Every day | ORAL | Status: DC
  Administered 2016-04-06: 40 meq via ORAL
  Filled 2016-04-06 (×2): qty 2

## 2016-04-06 MED ORDER — DORZOLAMIDE HCL 2 % OP SOLN
1.0000 [drp] | Freq: Three times a day (TID) | OPHTHALMIC | Status: DC
  Filled 2016-04-06: qty 10

## 2016-04-06 MED ORDER — ATROPINE SULFATE 1 % OP SOLN
OPHTHALMIC | Status: AC
  Filled 2016-04-06: qty 5

## 2016-04-06 MED ORDER — ACETAZOLAMIDE SODIUM 500 MG IJ SOLR
INTRAMUSCULAR | Status: AC
  Filled 2016-04-06: qty 500

## 2016-04-06 MED ORDER — HYDROCHLOROTHIAZIDE 25 MG PO TABS
12.5000 mg | ORAL_TABLET | Freq: Every day | ORAL | Status: DC
  Administered 2016-04-06: 12.5 mg via ORAL
  Filled 2016-04-06: qty 1

## 2016-04-06 MED ORDER — MIDAZOLAM HCL 5 MG/5ML IJ SOLN
INTRAMUSCULAR | Status: DC | PRN
  Administered 2016-04-06: 2 mg via INTRAVENOUS

## 2016-04-06 MED ORDER — FENTANYL CITRATE (PF) 100 MCG/2ML IJ SOLN
25.0000 ug | INTRAMUSCULAR | Status: DC | PRN
  Administered 2016-04-06: 25 ug via INTRAVENOUS

## 2016-04-06 MED ORDER — SODIUM CHLORIDE 0.45 % IV SOLN
INTRAVENOUS | Status: DC
  Administered 2016-04-06: 17:00:00 via INTRAVENOUS

## 2016-04-06 MED ORDER — SODIUM CHLORIDE 0.9 % IJ SOLN
INTRAMUSCULAR | Status: AC
  Filled 2016-04-06: qty 10

## 2016-04-06 SURGICAL SUPPLY — 59 items
BLADE EYE CATARACT 19 1.4 BEAV (BLADE) IMPLANT
BLADE MVR KNIFE 19G (BLADE) IMPLANT
BLADE MVR KNIFE 20G (BLADE) ×2 IMPLANT
CANNULA VLV SOFT TIP 25GA (OPHTHALMIC) ×4 IMPLANT
CORDS BIPOLAR (ELECTRODE) ×2 IMPLANT
COTTONBALL LRG STERILE PKG (GAUZE/BANDAGES/DRESSINGS) ×6 IMPLANT
COVER MAYO STAND STRL (DRAPES) IMPLANT
DRAPE INCISE 51X51 W/FILM STRL (DRAPES) IMPLANT
DRAPE OPHTHALMIC 77X100 STRL (CUSTOM PROCEDURE TRAY) ×2 IMPLANT
ERASER HMR WETFIELD 23G BP (MISCELLANEOUS) ×2 IMPLANT
FILTER BLUE MILLIPORE (MISCELLANEOUS) IMPLANT
FILTER STRAW FLUID ASPIR (MISCELLANEOUS) ×2 IMPLANT
FORCEPS GRIESHABER ILM 25G A (INSTRUMENTS) IMPLANT
GAS AUTO FILL CONSTEL (OPHTHALMIC) ×4
GAS AUTO FILL CONSTELLATION (OPHTHALMIC) ×2 IMPLANT
GLOVE SS BIOGEL STRL SZ 6.5 (GLOVE) ×1 IMPLANT
GLOVE SS BIOGEL STRL SZ 7 (GLOVE) ×1 IMPLANT
GLOVE SUPERSENSE BIOGEL SZ 6.5 (GLOVE) ×1
GLOVE SUPERSENSE BIOGEL SZ 7 (GLOVE) ×1
GLOVE SURG 8.5 LATEX PF (GLOVE) ×2 IMPLANT
GOWN STRL REUS W/ TWL LRG LVL3 (GOWN DISPOSABLE) ×3 IMPLANT
GOWN STRL REUS W/TWL LRG LVL3 (GOWN DISPOSABLE) ×3
HANDLE PNEUMATIC FOR CONSTEL (OPHTHALMIC) IMPLANT
KIT BASIN OR (CUSTOM PROCEDURE TRAY) ×2 IMPLANT
KNIFE GRIESHABER SHARP 2.5MM (MISCELLANEOUS) IMPLANT
MICROPICK 25G (MISCELLANEOUS)
NEEDLE 18GX1X1/2 (RX/OR ONLY) (NEEDLE) ×2 IMPLANT
NEEDLE 25GX 5/8IN NON SAFETY (NEEDLE) ×2 IMPLANT
NEEDLE FILTER BLUNT 18X 1/2SAF (NEEDLE)
NEEDLE FILTER BLUNT 18X1 1/2 (NEEDLE) IMPLANT
NEEDLE HYPO 30X.5 LL (NEEDLE) IMPLANT
NS IRRIG 1000ML POUR BTL (IV SOLUTION) ×2 IMPLANT
PACK FRAGMATOME (OPHTHALMIC) IMPLANT
PACK VITRECTOMY CUSTOM (CUSTOM PROCEDURE TRAY) ×2 IMPLANT
PAD ARMBOARD 7.5X6 YLW CONV (MISCELLANEOUS) ×4 IMPLANT
PAK PIK VITRECTOMY CVS 25GA (OPHTHALMIC) ×2 IMPLANT
PIC ILLUMINATED 25G (OPHTHALMIC) ×2
PICK MICROPICK 25G (MISCELLANEOUS) IMPLANT
PIK ILLUMINATED 25G (OPHTHALMIC) ×1 IMPLANT
PROBE LASER ILLUM FLEX CVD 25G (OPHTHALMIC) IMPLANT
REPL STRA BRUSH NEEDLE (NEEDLE) ×4 IMPLANT
RESERVOIR BACK FLUSH (MISCELLANEOUS) ×4 IMPLANT
ROLLS DENTAL (MISCELLANEOUS) ×4 IMPLANT
SCRAPER DIAMOND 25GA (OPHTHALMIC RELATED) IMPLANT
SCRAPER DIAMOND DUST MEMBRANE (MISCELLANEOUS) ×2 IMPLANT
SPONGE SURGIFOAM ABS GEL 12-7 (HEMOSTASIS) ×2 IMPLANT
STOPCOCK 4 WAY LG BORE MALE ST (IV SETS) IMPLANT
SUT CHROMIC 7 0 TG140 8 (SUTURE) IMPLANT
SUT ETHILON 9 0 TG140 8 (SUTURE) ×2 IMPLANT
SUT POLY NON ABSORB 10-0 8 STR (SUTURE) IMPLANT
SUT SILK 4 0 RB 1 (SUTURE) IMPLANT
SYR 20CC LL (SYRINGE) ×2 IMPLANT
SYR 5ML LL (SYRINGE) IMPLANT
SYR BULB 3OZ (MISCELLANEOUS) ×2 IMPLANT
SYR TB 1ML LUER SLIP (SYRINGE) ×2 IMPLANT
SYRINGE 10CC LL (SYRINGE) IMPLANT
TUBING HIGH PRESS EXTEN 6IN (TUBING) IMPLANT
WATER STERILE IRR 1000ML POUR (IV SOLUTION) ×2 IMPLANT
WIPE INSTRUMENT VISIWIPE 73X73 (MISCELLANEOUS) IMPLANT

## 2016-04-06 NOTE — Anesthesia Preprocedure Evaluation (Addendum)
Anesthesia Evaluation  Patient identified by MRN, date of birth, ID band Patient awake    Reviewed: Allergy & Precautions, NPO status , Patient's Chart, lab work & pertinent test results  Airway Mallampati: II  TM Distance: >3 FB Neck ROM: Full    Dental  (+) Dental Advisory Given, Teeth Intact,    Pulmonary neg pulmonary ROS,    breath sounds clear to auscultation       Cardiovascular hypertension, Pt. on medications  Rhythm:Regular Rate:Normal     Neuro/Psych Anxiety Depression Multiple sclerosis    GI/Hepatic negative GI ROS, Neg liver ROS,   Endo/Other  negative endocrine ROS  Renal/GU negative Renal ROS     Musculoskeletal  (+) Arthritis ,   Abdominal   Peds  Hematology negative hematology ROS (+)   Anesthesia Other Findings   Reproductive/Obstetrics                          No results found for: WBC, HGB, HCT, MCV, PLT No results found for: CREATININE, BUN, NA, K, CL, CO2  Anesthesia Physical Anesthesia Plan  ASA: II  Anesthesia Plan: General   Post-op Pain Management:    Induction: Intravenous  Airway Management Planned: Oral ETT  Additional Equipment:   Intra-op Plan:   Post-operative Plan: Extubation in OR  Informed Consent: I have reviewed the patients History and Physical, chart, labs and discussed the procedure including the risks, benefits and alternatives for the proposed anesthesia with the patient or authorized representative who has indicated his/her understanding and acceptance.   Dental advisory given  Plan Discussed with: CRNA  Anesthesia Plan Comments:         Anesthesia Quick Evaluation

## 2016-04-06 NOTE — Anesthesia Procedure Notes (Signed)
Procedure Name: Intubation Date/Time: 04/06/2016 11:49 AM Performed by: Ollen Bowl Pre-anesthesia Checklist: Patient identified, Emergency Drugs available, Patient being monitored, Timeout performed and Suction available Patient Re-evaluated:Patient Re-evaluated prior to inductionOxygen Delivery Method: Circle system utilized and Simple face mask Preoxygenation: Pre-oxygenation with 100% oxygen Intubation Type: IV induction Ventilation: Mask ventilation without difficulty Laryngoscope Size: Miller and 2 Grade View: Grade I Tube type: Oral Tube size: 7.5 mm Number of attempts: 1 Airway Equipment and Method: Patient positioned with wedge pillow and Stylet Placement Confirmation: ETT inserted through vocal cords under direct vision,  positive ETCO2 and breath sounds checked- equal and bilateral Secured at: 21 cm Tube secured with: Tape Dental Injury: Teeth and Oropharynx as per pre-operative assessment

## 2016-04-06 NOTE — H&P (Signed)
I examined the patient today and there is no change in the medical status 

## 2016-04-06 NOTE — Brief Op Note (Signed)
Brief Operative note   Preoperative diagnosis:  macular hole left eye Postoperative diagnosis  Macular hole left eye   Procedures: repair macular hole with pars plana vitrectomy, membrane peel, serum patch, laser, gas injection left eye  Surgeon:  Hayden Pedro, MD...  Assistant:  Deatra Ina SA    Anesthesia: General  Specimen: none  Estimated blood loss:  1cc  Complications: none  Patient sent to PACU in good condition  Composed by Hayden Pedro MD  Dictation number: [id number]

## 2016-04-06 NOTE — Transfer of Care (Signed)
Immediate Anesthesia Transfer of Care Note  Patient: Tracey Snow  Procedure(s) Performed: Procedure(s): 25 GAUGE PARS PLANA VITRECTOMY WITH 20 GAUGE MVR PORT FOR MACULAR HOLE (Left) LASER PHOTO ABLATION (Left) SERUM PATCH (Left) MEMBRANE PEEL (Left) GAS/FLUID EXCHANGE (Left)  Patient Location: PACU  Anesthesia Type:General  Level of Consciousness: awake, alert  and oriented  Airway & Oxygen Therapy: Patient Spontanous Breathing and Patient connected to nasal cannula oxygen  Post-op Assessment: Report given to RN and Post -op Vital signs reviewed and stable  Post vital signs: Reviewed and stable  Last Vitals:  Vitals:   04/06/16 0930  BP: (!) 184/81  Pulse: 67  Resp: 18  Temp: 36.9 C    Last Pain:  Vitals:   04/06/16 0930  TempSrc: Oral         Complications: No apparent anesthesia complications

## 2016-04-06 NOTE — Op Note (Signed)
Tracey Snow, Tracey Snow            ACCOUNT NO.:  [id number]  MEDICAL RECORD NO.:  [id number]  LOCATION:  [facility]                         FACILITY:  [facility]  PHYSICIAN:  Chrystie Nose. Zigmund Daniel, M.D. DATE OF BIRTH:  15-Feb-1946  DATE OF PROCEDURE:  04/06/2016 DATE OF DISCHARGE:                              OPERATIVE REPORT   ADMISSION DIAGNOSIS:  Macular hole, left eye.  PROCEDURE:  Repair of macular hole with pars plana vitrectomy, retinal photocoagulation, gas fluid exchange, membrane peel, serum patch all in the left eye.  SURGEON:  Chrystie Nose. Zigmund Daniel, MD  ASSISTANT:  Deatra Ina, SA  ANESTHESIA:  General.  DETAILS:  Usual prep and drape, 25-gauge trocars placed at 4 and 10 respectively.  A 3 layered MVR incision was made through the sclera with diamond knife at 2 o'clock.  The conjunctiva was trimmed with Westcott scissors.  Diathermy was used for hemostasis.  The contact lens ring was anchored into place at 6 and 12 o'clock.  The indirect ophthalmoscope laser was moved into place, 473 burns were placed around the retinal periphery.  The power was 500 mW, 1000 microns each, and 0.1 seconds each.  Then the contact lens ring was anchored into place at 6 and 12 o'clock.  Provisc placed on the corneal surface and the flat contact lens was placed.  Pars plana vitrectomy was begun with the lighted pick and the vitreous cutter just behind the cataractous lens.  Membranes were encountered and carefully removed under low suction and rapid cutting.  The core vitrectomy was carried down to the macular surface. The cutter was drawn through the macular region removing some viscosity of vitreous.  Vitrectomy was carried into the mid periphery where additional strands of vitreous were removed from their attachments to the mid periphery.  The silicone tip suction line was brought in the eye and was drawn down toward the macular hole.  The fish strike sign occurred and vitreous posterior hyaloid jumped  to the silicone tip suction line.  The lighted pick was used to engage and peel the posterior hyaloid.  Vitreous cutter was repositioned in the eye.  These portions of vitreous were carefully removed under low suction and rapid cutting.  The vitrectomy was carried into the far periphery and the 30 degree prismatic contact lens was placed.  The vitrectomy was carried for 360 degrees in the far periphery.  The magnifying contact lens was then placed onto a layer of methylcellulose and onto the cornea.  The macular hole was inspected.  The diamond-dusted membrane scraper was used to engage the external limiting membrane against the internal limiting membrane from around the edge of the macular hole.  The ILM was removed for approximately 1 disc diameter around the macular hole.  The edges of the hole were found to be mobile, at this point, they were pushed toward the center of the hole.  A total gas fluid exchange was carried out at this point, additional time was allowed for fluid to track down the walls of the eye and collecting the posterior segment. The serum patch was repaired during this time, and the C3F8 was prepared into a 14% concentration for intravitreal injection.  Serum patch was delivered.  Additional fluid and serum was removed from the posterior segment until the retina was dry.  C3F8 14% was exchanged for intravitreal gas.  The scleral wound was closed with 1 interrupted 9-0 nylon suture.  The conjunctiva was closed with wet-field cautery.  The instruments and trocars were removed from the eye.  The wounds were tested and found to be secure.  Polymyxin and ceftazidime were rinsed around the globe for antibiotic coverage.  Decadron 10 mg was injected into the lower subconjunctival space.  Marcaine was injected around the globe for postop pain.  Atropine solution was applied. Closing pressure was 10 with a Barraquer tonometer.  Polysporin ophthalmic ointment and patch and  shield were placed.  The patient was awakened and taken to recovery in satisfactory condition.     Chrystie Nose. Zigmund Daniel, M.D.     JDM/MEDQ  D:  04/06/2016  T:  04/06/2016  Job:  [id number]

## 2016-04-07 ENCOUNTER — Encounter (HOSPITAL_COMMUNITY): Payer: Self-pay | Admitting: Ophthalmology

## 2016-04-07 DIAGNOSIS — H35342 Macular cyst, hole, or pseudohole, left eye: Secondary | ICD-10-CM | POA: Diagnosis not present

## 2016-04-07 MED ORDER — DORZOLAMIDE HCL 2 % OP SOLN
1.0000 [drp] | Freq: Three times a day (TID) | OPHTHALMIC | 12 refills | Status: DC
Start: 2016-04-07 — End: 2022-07-15

## 2016-04-07 MED ORDER — GATIFLOXACIN 0.5 % OP SOLN: 1.0000 [drp] | Freq: Four times a day (QID) | OPHTHALMIC | Status: DC

## 2016-04-07 MED ORDER — PREDNISOLONE ACETATE 1 % OP SUSP: 1.0000 [drp] | Freq: Four times a day (QID) | OPHTHALMIC | 0 refills | Status: DC

## 2016-04-07 MED ORDER — BRIMONIDINE TARTRATE 0.2 % OP SOLN: 1.0000 [drp] | Freq: Two times a day (BID) | OPHTHALMIC | 12 refills | Status: DC

## 2016-04-07 MED ORDER — POTASSIUM CHLORIDE CRYS ER 20 MEQ PO TBCR: 40.0000 meq | EXTENDED_RELEASE_TABLET | Freq: Every day | ORAL | Status: DC

## 2016-04-07 MED ORDER — BACITRACIN-POLYMYXIN B 500-10000 UNIT/GM OP OINT: 1.0000 "application " | TOPICAL_OINTMENT | Freq: Three times a day (TID) | OPHTHALMIC | 0 refills | Status: DC

## 2016-04-07 NOTE — Anesthesia Postprocedure Evaluation (Signed)
Anesthesia Post Note  Patient: Tracey Snow  Procedure(s) Performed: Procedure(s) (LRB): 25 GAUGE PARS PLANA VITRECTOMY WITH 20 GAUGE MVR PORT FOR MACULAR HOLE (Left) LASER PHOTO ABLATION (Left) SERUM PATCH (Left) MEMBRANE PEEL (Left) GAS/FLUID EXCHANGE (Left)  Patient location during evaluation: PACU Anesthesia Type: General Level of consciousness: awake and alert Pain management: pain level controlled Vital Signs Assessment: post-procedure vital signs reviewed and stable Respiratory status: spontaneous breathing, nonlabored ventilation, respiratory function stable and patient connected to nasal cannula oxygen Cardiovascular status: blood pressure returned to baseline and stable Postop Assessment: no signs of nausea or vomiting Anesthetic complications: no     Last Vitals:  Vitals:   04/07/16 0223 04/07/16 0458  BP: 121/77 (!) 148/83  Pulse: 75 87  Resp: 16 16  Temp: 36.6 C 36.5 C    Last Pain:  Vitals:   04/07/16 0458  TempSrc: Oral  PainSc:    Pain Goal:                 Tiajuana Amass

## 2016-04-07 NOTE — Progress Notes (Signed)
04/07/2016, 6:32 AM  Mental Status:  Awake, Alert, Oriented  Anterior segment: Cornea  Clear    Anterior Chamber Clear    Lens:   Cataract  Intra Ocular Pressure 30  mmHg with Tonopen  Vitreous: Clear 95%gas bubble   Retina:  Attached Good laser reaction   Impression: Excellent result Retina attached   Final Diagnosis: Principal Problem:   Macular hole, left eye   Plan: start post operative eye drops.  Add glaucoma drops.  Discharge to home.  Give post operative instructions  Tracey Snow 04/07/2016, 6:32 AM

## 2016-04-07 NOTE — Discharge Summary (Signed)
Discharge summary not needed on [facility] patients per medical records. 

## 2016-04-13 ENCOUNTER — Encounter (INDEPENDENT_AMBULATORY_CARE_PROVIDER_SITE_OTHER): Payer: Medicare HMO | Admitting: Ophthalmology

## 2016-04-13 DIAGNOSIS — H35342 Macular cyst, hole, or pseudohole, left eye: Secondary | ICD-10-CM

## 2016-05-03 ENCOUNTER — Encounter (INDEPENDENT_AMBULATORY_CARE_PROVIDER_SITE_OTHER): Payer: Medicare HMO | Admitting: Ophthalmology

## 2016-05-03 DIAGNOSIS — H35342 Macular cyst, hole, or pseudohole, left eye: Secondary | ICD-10-CM

## 2016-07-12 ENCOUNTER — Encounter (INDEPENDENT_AMBULATORY_CARE_PROVIDER_SITE_OTHER): Payer: Medicare HMO | Admitting: Ophthalmology

## 2016-07-12 DIAGNOSIS — H35033 Hypertensive retinopathy, bilateral: Secondary | ICD-10-CM | POA: Diagnosis not present

## 2016-07-12 DIAGNOSIS — H2513 Age-related nuclear cataract, bilateral: Secondary | ICD-10-CM

## 2016-07-12 DIAGNOSIS — H35342 Macular cyst, hole, or pseudohole, left eye: Secondary | ICD-10-CM | POA: Diagnosis not present

## 2016-07-12 DIAGNOSIS — H43811 Vitreous degeneration, right eye: Secondary | ICD-10-CM | POA: Diagnosis not present

## 2016-07-12 DIAGNOSIS — I10 Essential (primary) hypertension: Secondary | ICD-10-CM

## 2016-10-12 ENCOUNTER — Encounter (INDEPENDENT_AMBULATORY_CARE_PROVIDER_SITE_OTHER): Payer: Medicare HMO | Admitting: Ophthalmology

## 2016-10-12 DIAGNOSIS — I10 Essential (primary) hypertension: Secondary | ICD-10-CM

## 2016-10-12 DIAGNOSIS — H35342 Macular cyst, hole, or pseudohole, left eye: Secondary | ICD-10-CM | POA: Diagnosis not present

## 2016-10-12 DIAGNOSIS — H35033 Hypertensive retinopathy, bilateral: Secondary | ICD-10-CM | POA: Diagnosis not present

## 2016-10-12 DIAGNOSIS — H43811 Vitreous degeneration, right eye: Secondary | ICD-10-CM | POA: Diagnosis not present

## 2017-10-12 ENCOUNTER — Ambulatory Visit (INDEPENDENT_AMBULATORY_CARE_PROVIDER_SITE_OTHER): Payer: Medicare HMO | Admitting: Ophthalmology

## 2017-11-08 ENCOUNTER — Encounter (INDEPENDENT_AMBULATORY_CARE_PROVIDER_SITE_OTHER): Payer: Medicare HMO | Admitting: Ophthalmology

## 2017-11-08 DIAGNOSIS — H35033 Hypertensive retinopathy, bilateral: Secondary | ICD-10-CM | POA: Diagnosis not present

## 2017-11-08 DIAGNOSIS — H35342 Macular cyst, hole, or pseudohole, left eye: Secondary | ICD-10-CM | POA: Diagnosis not present

## 2017-11-08 DIAGNOSIS — H43811 Vitreous degeneration, right eye: Secondary | ICD-10-CM

## 2017-11-08 DIAGNOSIS — I10 Essential (primary) hypertension: Secondary | ICD-10-CM

## 2018-04-11 DIAGNOSIS — E559 Vitamin D deficiency, unspecified: Secondary | ICD-10-CM | POA: Insufficient documentation

## 2018-11-14 ENCOUNTER — Encounter (INDEPENDENT_AMBULATORY_CARE_PROVIDER_SITE_OTHER): Payer: Medicare Other | Admitting: Ophthalmology

## 2018-12-26 ENCOUNTER — Other Ambulatory Visit: Payer: Self-pay | Admitting: Unknown Physician Specialty

## 2019-01-02 ENCOUNTER — Other Ambulatory Visit: Payer: Self-pay

## 2019-01-02 ENCOUNTER — Encounter (INDEPENDENT_AMBULATORY_CARE_PROVIDER_SITE_OTHER): Payer: Medicare Other | Admitting: Ophthalmology

## 2019-01-02 DIAGNOSIS — H35033 Hypertensive retinopathy, bilateral: Secondary | ICD-10-CM

## 2019-01-02 DIAGNOSIS — I10 Essential (primary) hypertension: Secondary | ICD-10-CM

## 2019-01-02 DIAGNOSIS — H35342 Macular cyst, hole, or pseudohole, left eye: Secondary | ICD-10-CM

## 2019-01-02 DIAGNOSIS — H43813 Vitreous degeneration, bilateral: Secondary | ICD-10-CM | POA: Diagnosis not present

## 2019-01-15 ENCOUNTER — Other Ambulatory Visit: Payer: Self-pay | Admitting: Unknown Physician Specialty

## 2019-01-15 DIAGNOSIS — Z1231 Encounter for screening mammogram for malignant neoplasm of breast: Secondary | ICD-10-CM

## 2019-03-05 ENCOUNTER — Other Ambulatory Visit: Payer: Self-pay

## 2019-03-05 ENCOUNTER — Ambulatory Visit
Admission: RE | Admit: 2019-03-05 | Discharge: 2019-03-05 | Disposition: A | Discharge status: Home / Self Care | Payer: Medicare Other | Source: Ambulatory Visit | Attending: Unknown Physician Specialty | Admitting: Unknown Physician Specialty

## 2019-03-05 DIAGNOSIS — Z1231 Encounter for screening mammogram for malignant neoplasm of breast: Secondary | ICD-10-CM

## 2020-01-02 ENCOUNTER — Other Ambulatory Visit: Payer: Self-pay

## 2020-01-02 ENCOUNTER — Encounter (INDEPENDENT_AMBULATORY_CARE_PROVIDER_SITE_OTHER): Payer: Medicare Other | Admitting: Ophthalmology

## 2020-01-02 DIAGNOSIS — H43813 Vitreous degeneration, bilateral: Secondary | ICD-10-CM

## 2020-01-02 DIAGNOSIS — H35342 Macular cyst, hole, or pseudohole, left eye: Secondary | ICD-10-CM | POA: Diagnosis not present

## 2020-01-24 ENCOUNTER — Other Ambulatory Visit: Payer: Self-pay | Admitting: Unknown Physician Specialty

## 2020-01-24 DIAGNOSIS — Z1231 Encounter for screening mammogram for malignant neoplasm of breast: Secondary | ICD-10-CM

## 2020-03-06 ENCOUNTER — Other Ambulatory Visit: Payer: Self-pay

## 2020-03-06 ENCOUNTER — Ambulatory Visit
Admission: RE | Admit: 2020-03-06 | Discharge: 2020-03-06 | Disposition: A | Discharge status: Home / Self Care | Payer: Medicare Other | Source: Ambulatory Visit | Attending: Unknown Physician Specialty | Admitting: Unknown Physician Specialty

## 2020-03-06 DIAGNOSIS — Z1231 Encounter for screening mammogram for malignant neoplasm of breast: Secondary | ICD-10-CM

## 2020-04-17 LAB — HEPATIC FUNCTION PANEL
ALT: 21 U/L (ref 7–35)
AST: 20 (ref 13–35)
Alkaline Phosphatase: 80 (ref 25–125)
Bilirubin, Total: 0.5

## 2020-04-17 LAB — BASIC METABOLIC PANEL
BUN: 14 (ref 4–21)
CO2: 26 — AB (ref 13–22)
Chloride: 105 (ref 99–108)
Creatinine: 0.9 (ref 0.5–1.1)
Glucose: 94
Potassium: 4.3 mEq/L (ref 3.5–5.1)
Sodium: 142 (ref 137–147)

## 2020-04-17 LAB — COMPREHENSIVE METABOLIC PANEL
Albumin: 4.4 (ref 3.5–5.0)
Calcium: 9.5 (ref 8.7–10.7)
Globulin: 2.4
eGFR: 64

## 2020-04-17 LAB — LIPID PANEL
Cholesterol: 164 (ref 0–200)
HDL: 65 (ref 35–70)
LDL Cholesterol: 82
Triglycerides: 93 (ref 40–160)

## 2020-04-17 LAB — VITAMIN D 25 HYDROXY (VIT D DEFICIENCY, FRACTURES): Vit D, 25-Hydroxy: 63.8

## 2020-10-01 ENCOUNTER — Encounter: Payer: Self-pay | Admitting: Gastroenterology

## 2020-10-27 ENCOUNTER — Other Ambulatory Visit: Payer: Self-pay | Admitting: Unknown Physician Specialty

## 2020-10-27 DIAGNOSIS — Z0000 Encounter for general adult medical examination without abnormal findings: Secondary | ICD-10-CM

## 2021-01-01 ENCOUNTER — Encounter (INDEPENDENT_AMBULATORY_CARE_PROVIDER_SITE_OTHER): Payer: Medicare Other | Admitting: Ophthalmology

## 2021-01-13 ENCOUNTER — Other Ambulatory Visit: Payer: Self-pay

## 2021-01-13 ENCOUNTER — Encounter (INDEPENDENT_AMBULATORY_CARE_PROVIDER_SITE_OTHER): Payer: Medicare Other | Admitting: Ophthalmology

## 2021-01-13 DIAGNOSIS — I10 Essential (primary) hypertension: Secondary | ICD-10-CM | POA: Diagnosis not present

## 2021-01-13 DIAGNOSIS — H35342 Macular cyst, hole, or pseudohole, left eye: Secondary | ICD-10-CM | POA: Diagnosis not present

## 2021-01-13 DIAGNOSIS — H43811 Vitreous degeneration, right eye: Secondary | ICD-10-CM

## 2021-01-13 DIAGNOSIS — H35033 Hypertensive retinopathy, bilateral: Secondary | ICD-10-CM | POA: Diagnosis not present

## 2021-04-27 LAB — LIPID PANEL
Cholesterol: 189 (ref 0–200)
HDL: 76 — AB (ref 35–70)
LDL Cholesterol: 95
LDl/HDL Ratio: 1.3
Triglycerides: 100 (ref 40–160)

## 2021-04-27 LAB — BASIC METABOLIC PANEL
BUN: 11 (ref 4–21)
CO2: 25 — AB (ref 13–22)
Chloride: 104 (ref 99–108)
Creatinine: 0.9 (ref 0.5–1.1)
Glucose: 93
Potassium: 4.4 mEq/L (ref 3.5–5.1)
Sodium: 145 (ref 137–147)

## 2021-04-27 LAB — COMPREHENSIVE METABOLIC PANEL
Albumin: 4.5 (ref 3.5–5.0)
Calcium: 9.4 (ref 8.7–10.7)
Globulin: 2.6
eGFR: 72

## 2021-04-27 LAB — HEPATIC FUNCTION PANEL
ALT: 24 U/L (ref 7–35)
AST: 20 (ref 13–35)
Alkaline Phosphatase: 95 (ref 25–125)
Bilirubin, Total: 0.5

## 2021-05-07 LAB — COLOGUARD: Cologuard: NEGATIVE

## 2021-05-13 LAB — COLOGUARD: COLOGUARD: NEGATIVE

## 2022-01-13 ENCOUNTER — Encounter (INDEPENDENT_AMBULATORY_CARE_PROVIDER_SITE_OTHER): Payer: Medicare Other | Admitting: Ophthalmology

## 2022-01-13 DIAGNOSIS — H43811 Vitreous degeneration, right eye: Secondary | ICD-10-CM | POA: Diagnosis not present

## 2022-01-13 DIAGNOSIS — H26492 Other secondary cataract, left eye: Secondary | ICD-10-CM

## 2022-01-13 DIAGNOSIS — H35033 Hypertensive retinopathy, bilateral: Secondary | ICD-10-CM | POA: Diagnosis not present

## 2022-01-13 DIAGNOSIS — H35342 Macular cyst, hole, or pseudohole, left eye: Secondary | ICD-10-CM | POA: Diagnosis not present

## 2022-01-13 DIAGNOSIS — I10 Essential (primary) hypertension: Secondary | ICD-10-CM

## 2022-01-21 ENCOUNTER — Ambulatory Visit: Payer: Medicare Other | Admitting: Neurology

## 2022-01-21 ENCOUNTER — Encounter: Payer: Self-pay | Admitting: Neurology

## 2022-01-21 VITALS — BP 141/90 | HR 65 | Ht 62.0 in | Wt 131.0 lb

## 2022-01-21 DIAGNOSIS — G35 Multiple sclerosis: Secondary | ICD-10-CM

## 2022-01-21 DIAGNOSIS — R4189 Other symptoms and signs involving cognitive functions and awareness: Secondary | ICD-10-CM | POA: Diagnosis not present

## 2022-01-21 MED ORDER — ALPRAZOLAM 0.5 MG PO TABS: ORAL_TABLET | ORAL | 5 refills | Status: DC

## 2022-01-21 NOTE — Progress Notes (Signed)
[facility]  PATIENT: Tracey Snow DOB: February 26, 1946  REFERRING DOCTOR OR PCP: Brayton Layman, MD [facility] neurology); Kip Corrington, MD(PCP) SOURCE: Patient, notes from Dr. Jacqulynn Cadet, imaging and lab reports, MRI images personally reviewed.  _________________________________   HISTORICAL  CHIEF COMPLAINT:  Chief Complaint  Patient presents with   New Patient (Initial Visit)    Rm 2, alone. Pt referred by Brayton Layman, here for [facility] for MS. Was on Betaseron since 1998 and stopped about 6 years ago. And created severe wounds in both arms. Pt reports doing well since being off DMT. Has had some balance problems and has been doing water exercise 3x a week.     HISTORY OF PRESENT ILLNESS:  I had the pleasure of seeing your patient, Tracey Snow, at [facility] for neurologic consultation regarding her multiple sclerosis.  She is a 76 year old woman who was diagnosed with MS in 1998.   She has been off all DMTs since 2017.   She has had no exacerbations since discontinuing.    Currently, she has reduced balance and can't stand on a chair or use heels.   She does water aerobics to try to stay active.  Gait is slightly worsening.   She uses the bannister on stairs.   She has no falls.   Her right leg is a little weaker than her left.    She denies much fatigue,   However, if she exercises she is tired afterwards.   She has some anxiety but not depression.  She noes mild cognitive issues wih reduced word finding.   She has some anxiety and is on Xanax 0.5 mg 3 pills most days  MS history: She was diagnosed with MS in 198 after presenting with numbness form the waist down.  MRIs were consistent with MS.   She was referred to Dr. Jacqulynn Cadet and Betaseron was started.     Her MS did ok but she did have a couple relapses, one with poor balance and slurred speech.   She received IV Solu-medrol for these.  In her 35's she was stable.   She stopped  Betaseron in 2017 due to skin necrosis in both arms.    She has had no exacerbations though has noted mild progression.    IMAGING: MRI of the brain 03/23/2019 shows multiple T2/FLAIR hyperintense foci in the periventricular, juxtacortical and deep white matter.  A couple small foci are noted in the pons and thalamus.  None of the foci appear to be acute.  They do not enhance.  According to the radiologist report, there is no change compared to the previous MRI.  From 2017.  Additionally, the patient has chronic sinusitis.  MRI of the cervical spine 03/23/2019 shows degenerative changes C4-C5 through C6-C7 with spinal stenosis and various degrees of foraminal narrowing..  The spinal cord has normal signal.  The radiologist report states relatively unchanged compared to 12/09/2015.   She notes some numbness in her hands, sometimes bothering her more at night.    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      Bladder function is stable.   She had a UTI last year.  She takes a diuretic and sometimes has mild urgency.    Her vision is stable.   No MS related issues but she has had cataracts and had a retinal issue.   She uses reading glasses.   REVIEW OF SYSTEMS: Constitutional: No fevers, chills, sweats, or  change in appetite Eyes: No visual changes, double vision, eye pain Ear, nose and throat: No hearing loss, ear pain, nasal congestion, sore throat Cardiovascular: No chest pain, palpitations Respiratory:  No shortness of breath at rest or with exertion.   No wheezes GastrointestinaI: No nausea, vomiting, diarrhea, abdominal pain, fecal incontinence Genitourinary:  No dysuria, urinary retention or frequency.  No nocturia. Musculoskeletal:  No neck pain, back pain Integumentary: No rash, pruritus, skin lesions Neurological: as above Psychiatric: No depression at this time.  No anxiety Endocrine: No palpitations, diaphoresis, change in appetite, change in weigh or  increased thirst Hematologic/Lymphatic:  No anemia, purpura, petechiae. Allergic/Immunologic: No itchy/runny eyes, nasal congestion, recent allergic reactions, rashes  ALLERGIES: Allergies  Allergen Reactions   Sulfonamide Derivatives Rash and Other (See Comments)    DIFFICULTY SWALLOWING    HOME MEDICATIONS:  Current Outpatient Medications:    acetaminophen (TYLENOL) 325 MG tablet, Take 650 mg by mouth every 6 (six) hours as needed for mild pain., Disp: , Rfl:    amLODipine (NORVASC) 5 MG tablet, Take 5 mg by mouth daily., Disp: , Rfl:    Citalopram Hydrobromide 30 MG CAPS, Take 30 mg by mouth daily., Disp: , Rfl:    dorzolamide (TRUSOPT) 2 % ophthalmic solution, Place 1 drop into the left eye 3 (three) times daily., Disp: 10 mL, Rfl: 12   gatifloxacin (ZYMAXID) 0.5 % SOLN, Place 1 drop into the left eye 4 (four) times daily., Disp: , Rfl:    hydrochlorothiazide (HYDRODIURIL) 25 MG tablet, Take 12.5 mg by mouth daily., Disp: , Rfl:    lisinopril (PRINIVIL,ZESTRIL) 40 MG tablet, Take 20 mg by mouth daily., Disp: , Rfl:    potassium chloride (KLOR-CON) 20 MEQ packet, Take 40 mEq by mouth daily., Disp: , Rfl:    rosuvastatin (CRESTOR) 20 MG tablet, Take 20 mg by mouth daily., Disp: , Rfl:    tiZANidine (ZANAFLEX) 4 MG capsule, Take 4 mg by mouth daily., Disp: , Rfl:    ALPRAZolam (XANAX) 0.5 MG tablet, One po tid, Disp: 90 tablet, Rfl: 5  PAST MEDICAL HISTORY: Past Medical History:  Diagnosis Date   Anxiety    Arthritis    "left side of my neck; left thumb" (04/06/2016)   Depression    per MD note   Family history of adverse reaction to anesthesia    son had n/v    High cholesterol    Hypertension    Multiple sclerosis (HCC)    Restless legs    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   Top of Right Foot-in situ (curet x 3,5FU)   SCCA (squamous cell carcinoma) of skin 09/08/2000   Right Ankle (curet x 3,5FU)   SCCA (squamous cell carcinoma) of skin 11/15/2017   Left  Lower Calf-in situ (Cx3,5FU)   Shortness of breath dyspnea    with exertion - due to MS   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   Left Lower Back (curet x 3,5FU)   Superficial basal cell carcinoma (BCC) 05/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   Lower Back (Cx3,5FU) and Lower Right Back (Cx3,5FU)    PAST SURGICAL HISTORY: Past Surgical History:  Procedure Laterality Date   25 GAUGE PARS PLANA VITRECTOMY WITH 20 GAUGE MVR PORT FOR MACULAR HOLE Left 04/06/2016   Procedure: 25 GAUGE PARS PLANA VITRECTOMY WITH 20 GAUGE MVR PORT FOR MACULAR HOLE;  Surgeon: Hayden Pedro, MD;  Location: [place];  Service: Ophthalmology;  Laterality: Left;   COLONOSCOPY     EYE SURGERY  GAS/FLUID EXCHANGE Left 04/06/2016   Procedure: GAS/FLUID EXCHANGE;  Surgeon: Hayden Pedro, MD;  Location: [place];  Service: Ophthalmology;  Laterality: Left;   LASER PHOTO ABLATION Left 04/06/2016   Procedure: LASER PHOTO ABLATION;  Surgeon: Hayden Pedro, MD;  Location: [place];  Service: Ophthalmology;  Laterality: Left;   MEMBRANE PEEL Left 04/06/2016   Procedure: MEMBRANE PEEL;  Surgeon: Hayden Pedro, MD;  Location: [place];  Service: Ophthalmology;  Laterality: Left;   NASAL SINUS SURGERY     PARS PLANA VITRECTOMY W/ REPAIR OF MACULAR HOLE Left 04/06/2016   SERUM PATCH Left 04/06/2016   Procedure: SERUM PATCH;  Surgeon: Hayden Pedro, MD;  Location: [place];  Service: Ophthalmology;  Laterality: Left;   TUBAL LIGATION      FAMILY HISTORY: Family History  Problem Relation Age of Onset   Throat cancer Mother    Alcoholism Father    Heart disease Father    Stroke Father    Breast cancer Neg Hx     SOCIAL HISTORY:  Social History   Socioeconomic History   Marital status: Legally Separated    Spouse name: Not on file   Number of children: 2   Years of education: Not on file   Highest education level: High school graduate  Occupational History   Not on file  Tobacco Use   Smoking status: Never   Smokeless tobacco: Never   Substance and Sexual Activity   Alcohol use: Yes    Comment: 04/06/2016 "might have 1 glass of wine/month, if that"   Drug use: No   Sexual activity: Never  Other Topics Concern   Not on file  Social History Narrative   Lives alone   R handed   Caffeine: 1 C of coffee a day, w half caffeine intake. Occa soda can    Social Determinants of Health   Financial Resource Strain: Not on file  Food Insecurity: Not on file  Transportation Needs: Not on file  Physical Activity: Not on file  Stress: Not on file  Social Connections: Not on file  Intimate Partner Violence: Not on file     PHYSICAL EXAM  Vitals:   01/21/22 1323  BP: (!) 141/90  Pulse: 65  Weight: 131 lb (59.4 kg)  Height: '5\' 2"'$  (1.575 m)    Body mass index is 23.96 kg/m.   General: The patient is well-developed and well-nourished and in no acute distress  HEENT:  Head is [place]/AT.  Sclera are anicteric.     Neck: No carotid bruits are noted.  The neck is nontender.but ROM is reduced  Cardiovascular: The heart has a regular rate and rhythm with a normal S1 and S2. There were no murmurs, gallops or rubs.    Skin: Extremities are without rash or  edema.  Musculoskeletal:  Back is nontender  Neurologic Exam  Mental status: The patient is alert and oriented x 3 at the time of the examination. The patient has apparent normal recent and remote memory, with an apparently normal attention span and concentration ability.   Speech is normal.  Cranial nerves: Extraocular movements are full. Pupils are equal, round, and reactive to light and accomodation.  Visual fields are full.  Facial symmetry is present. There is good facial sensation to soft touch bilaterally.Facial strength is normal.  Trapezius and sternocleidomastoid strength is normal. No dysarthria is noted.  The tongue is midline, and the patient has symmetric elevation of the soft palate. No obvious hearing deficits  are noted.  Motor:  Muscle bulk is normal.    Tone is normal. Strength is  5 / 5 in all 4 extremities.   Sensory: Sensory testing is intact to pinprick, soft touch and vibration sensation in all 4 extremities.  Coordination: Cerebellar testing reveals good finger-nose-finger and heel-to-shin bilaterally.  Gait and station: Station is normal.   Gait is normal. Tandem gait is mildly wide. Romberg is negative.   Reflexes: Deep tendon reflexes are symmetric and normal bilaterally.   Plantar responses are flexor.    DIAGNOSTIC DATA (LABS, IMAGING, TESTING) - I reviewed patient records, labs, notes, testing and imaging myself where available.  Lab Results  Component Value Date   WBC 5.8 04/06/2016   HGB 13.9 04/06/2016   HCT 42.1 04/06/2016   MCV 94.8 04/06/2016   PLT 225 04/06/2016      Component Value Date/Time   NA 139 04/06/2016 1017   K 3.4 (L) 04/06/2016 1017   CL 107 04/06/2016 1017   CO2 26 04/06/2016 1017   GLUCOSE 95 04/06/2016 1017   BUN 12 04/06/2016 1017   CREATININE 0.94 04/06/2016 1017   CALCIUM 9.2 04/06/2016 1017   GFRNONAA >60 04/06/2016 1017   GFRAA >60 04/06/2016 1017       ASSESSMENT AND PLAN  Multiple sclerosis ([place]) - Plan: MR BRAIN WO CONTRAST  Cognitive changes  In summary, Ms. Keiper is a 76 year old woman who was diagnosed with MS in 1998.  She is fortunate to have had a mild course.  After several exacerbations in her first couple years she has been relapse free.  She has been off of disease modifying therapies for about 6 years.  She had stopped Betaseron due to skin necrosis.  Her last MRI in 2020 was unchanged compared to 2017.  We discussed getting another MRI of the brain to determine if there is any subclinical progression.  If it is unchanged or shows no significant changes over the last 3 years she will remain off of a disease modifying therapy.  If there are significant new lesions consistent with MS then we will have to reconsider.  Her neurologist has been writing her alprazolam  and I will go ahead and refill this for her.  I will have her come back in about 6 months.  We did discuss that we could make the follow-up every year if her primary care was able to write for this medication.  She should call us back sooner if there are new or worsening neurologic symptoms.  We will bring her in sooner if she has significant findings on the brain MRI.  Thank you for asking me to see Ms. Cabacungan.  Please let me know if I can be of further assistance with her or other patients in the future.  Rosslyn Pasion A. Felecia Shelling, MD, [facility] 02/16/1422, 9:53 PM Certified in Neurology, Clinical Neurophysiology, Sleep Medicine and Neuroimaging  [facility] [address], [place], [place] [address] [phone]

## 2022-01-22 ENCOUNTER — Telehealth: Payer: Self-pay | Admitting: Neurology

## 2022-01-22 NOTE — Telephone Encounter (Signed)
[employer] [place] sent to GI they will call the patient to schedule

## 2022-02-11 ENCOUNTER — Encounter (INDEPENDENT_AMBULATORY_CARE_PROVIDER_SITE_OTHER): Payer: Medicare Other | Admitting: Ophthalmology

## 2022-03-04 ENCOUNTER — Encounter (INDEPENDENT_AMBULATORY_CARE_PROVIDER_SITE_OTHER): Payer: Medicare Other | Admitting: Ophthalmology

## 2022-03-09 ENCOUNTER — Ambulatory Visit
Admission: RE | Admit: 2022-03-09 | Discharge: 2022-03-09 | Disposition: A | Discharge status: Home / Self Care | Payer: Medicare Other | Source: Ambulatory Visit | Attending: Neurology | Admitting: Neurology

## 2022-03-09 DIAGNOSIS — G35 Multiple sclerosis: Secondary | ICD-10-CM

## 2022-03-11 ENCOUNTER — Encounter (INDEPENDENT_AMBULATORY_CARE_PROVIDER_SITE_OTHER): Payer: Medicare Other | Admitting: Ophthalmology

## 2022-03-11 DIAGNOSIS — Z961 Presence of intraocular lens: Secondary | ICD-10-CM

## 2022-03-18 ENCOUNTER — Telehealth: Payer: Self-pay | Admitting: Neurology

## 2022-03-18 NOTE — Telephone Encounter (Signed)
-----   Message from Britt Bottom, MD sent at 03/16/2022  6:49 PM EDT ----- Please let the patient know that the MRI of the brain performed last week was unchanged compared to the MRI from 2020.  Therefore, she can continue to remain off of a disease modifying therapy for MS.

## 2022-03-18 NOTE — Telephone Encounter (Signed)
Called the patient and made aware that there was no changes to the MRI in comparison from 2020. Advised Dr Tracey Snow states she can remain off DMT. Pt verbalized understanding. Pt had no questions at this time but was encouraged to call back if questions arise.

## 2022-05-06 LAB — HM AWV

## 2022-06-18 ENCOUNTER — Other Ambulatory Visit: Payer: Self-pay

## 2022-06-30 IMAGING — MG DIGITAL SCREENING BILAT W/ TOMO W/ CAD
6 of 12 series · 6 of 36 positions shown · non-contrast
Comparison: Previous exam(s).

CLINICAL DATA: Screening.

EXAM:
DIGITAL SCREENING BILATERAL MAMMOGRAM WITH TOMO AND CAD

[R CC synth-2D (1 of 2)]
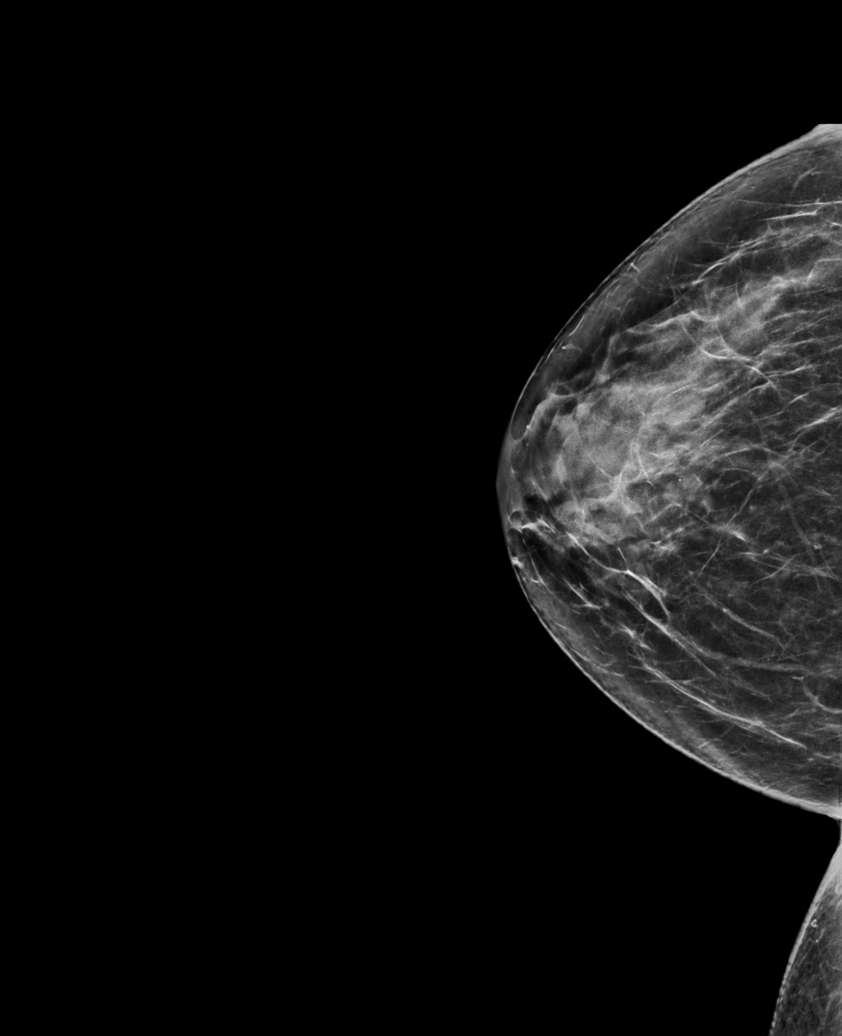

[R CC synth-2D (2 of 2)]
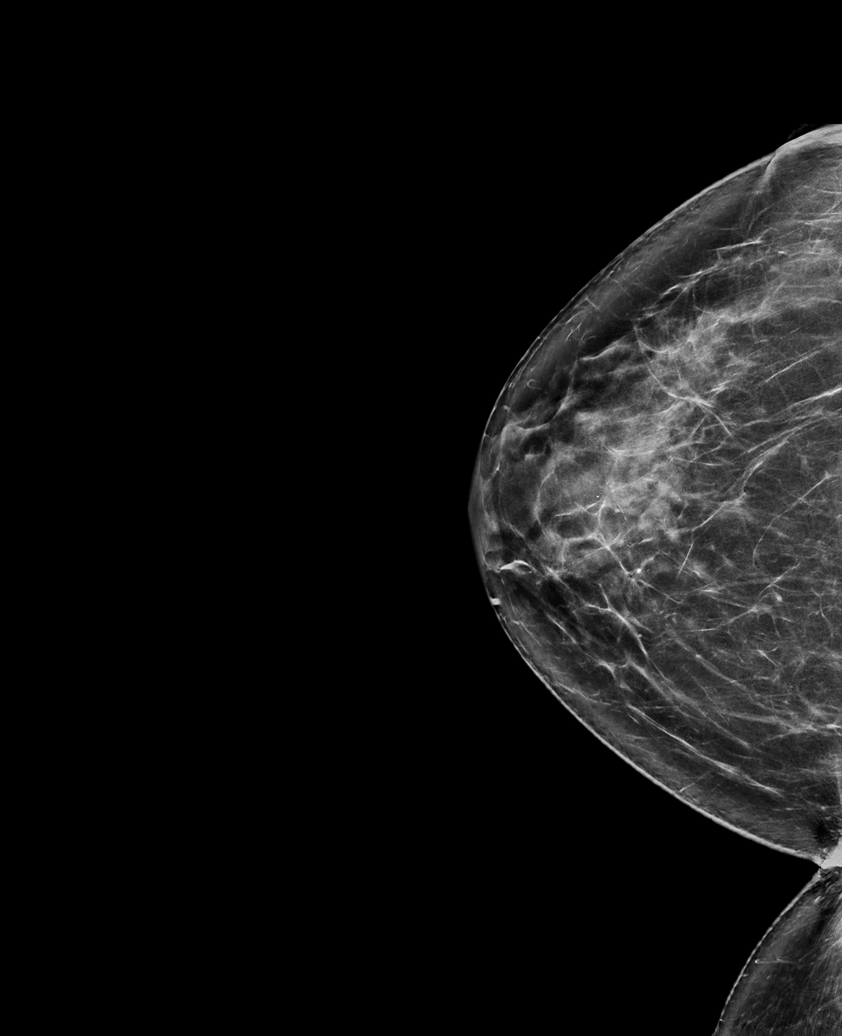

[L CC synth-2D (1 of 2)]
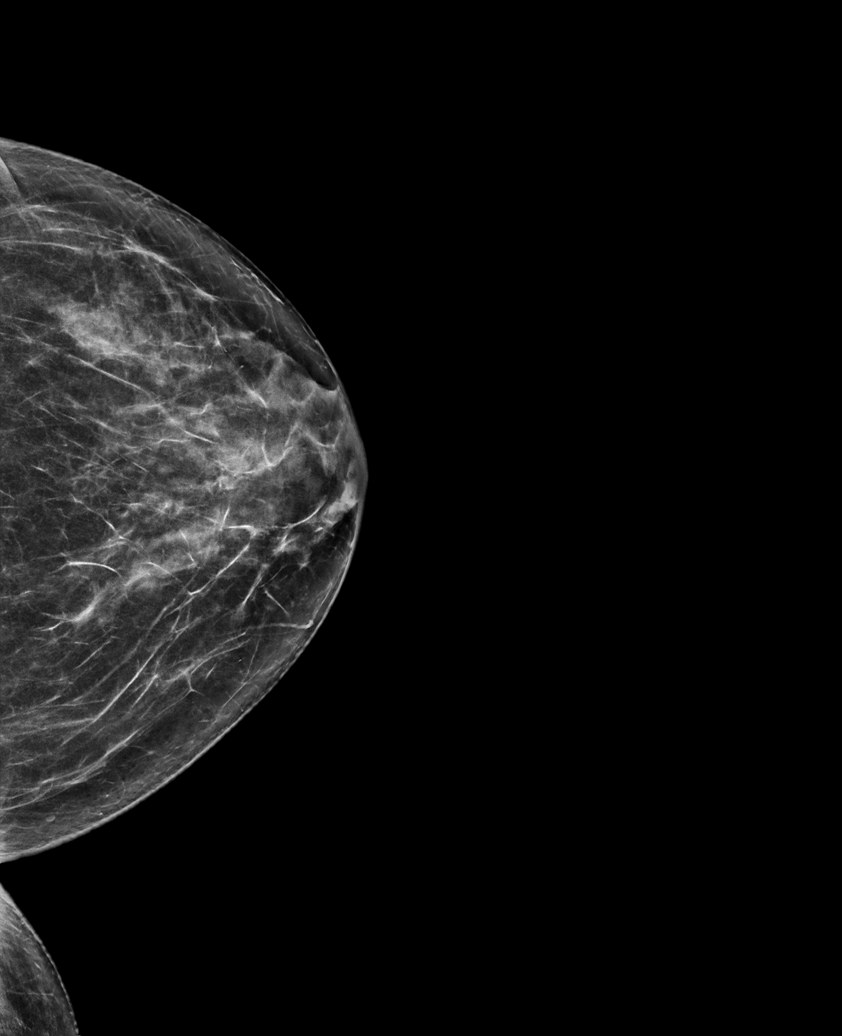

[L MLO synth-2D]
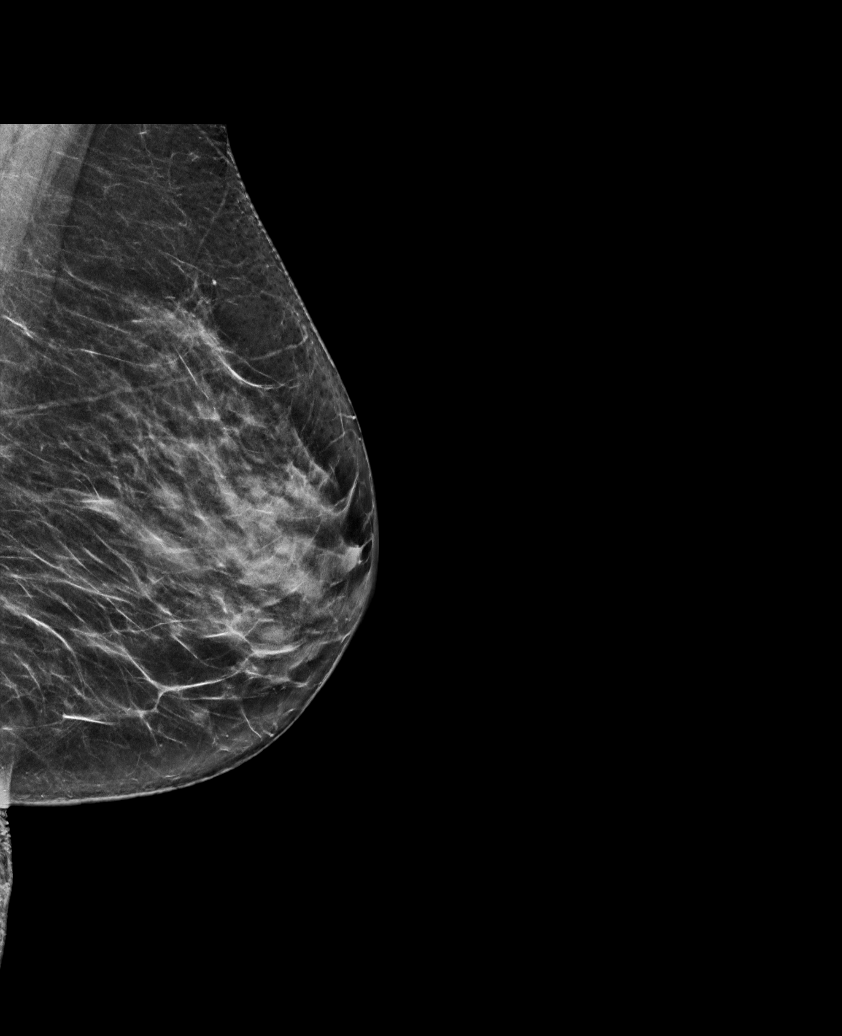

[R MLO synth-2D]
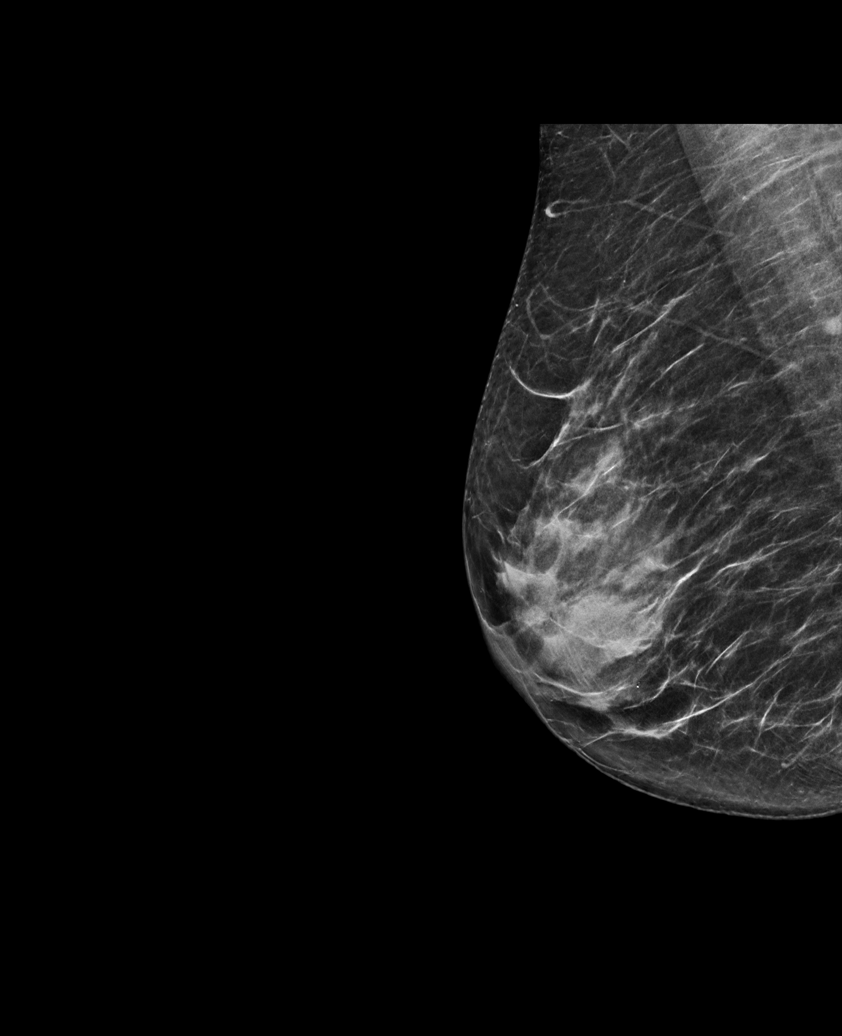

[L CC synth-2D (2 of 2)]
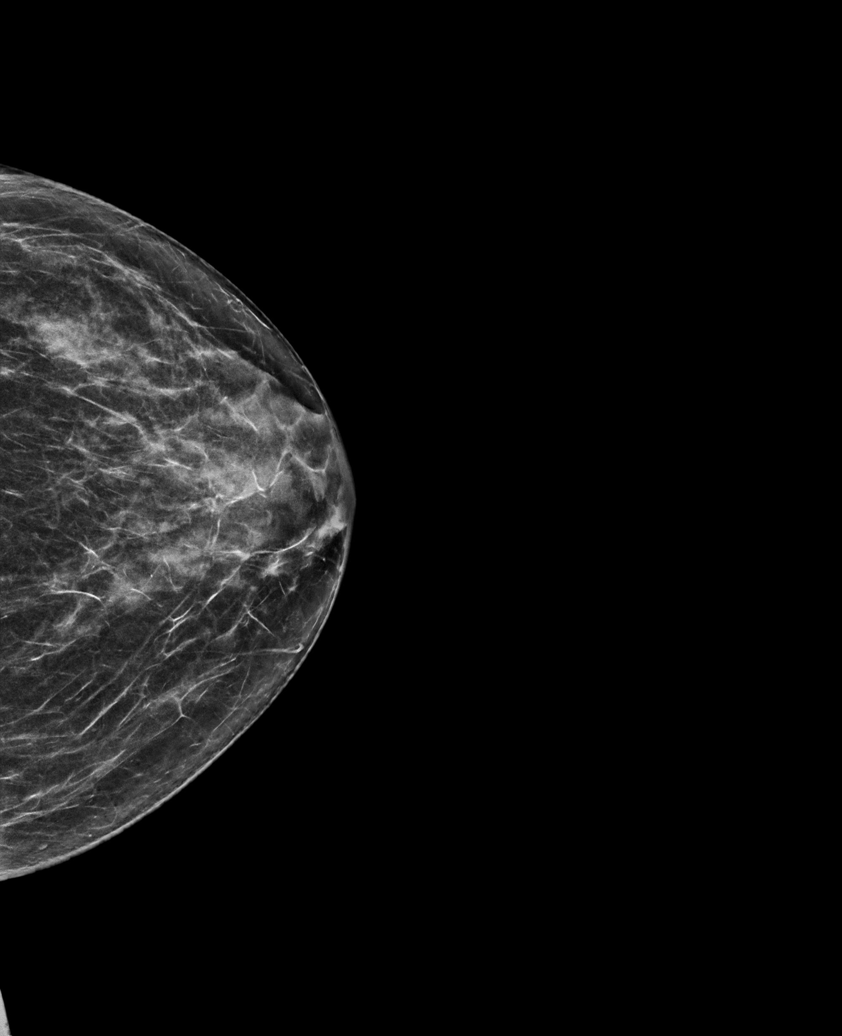

[6 of 36 positions shown; findings below may reference images not displayed]

ACR Breast Density Category c: The breast tissue is heterogeneously
dense, which may obscure small masses.
FINDINGS: There are no findings suspicious for malignancy. Images were
processed with CAD.
IMPRESSION: No mammographic evidence of malignancy. A result letter of this
screening mammogram will be mailed directly to the patient.

RECOMMENDATION:
Screening mammogram in one year. (Code:[id number]-[id number])

BI-RADS CATEGORY  1: Negative.

## 2022-07-13 ENCOUNTER — Encounter: Payer: Self-pay | Admitting: Family Medicine

## 2022-07-13 NOTE — Progress Notes (Unsigned)
Office Note 07/15/2022  CC:  Chief Complaint  Patient presents with   Establish Care    HPI:  Tracey Snow is a 76 y.o. female who is here to establish care and f/u HTN, HLD,  Patient's most recent primary MD: Dr. Ledora Bottcher Old records were reviewed prior to or during today's visit.  She feels well. Blood pressure historically has been well controlled.  Patient states that at a recent follow-up with her prior PCP he increased her citalopram to 1-1/2 of her 20 mg tabs.  She does feel improved regarding her chronic worry/anxiety.  She continues to also take alprazolam as needed--this is prescribed by her neurologist Dr. Felecia Shelling.   PMP AWARE reviewed today: most recent rx for alprazolam was filled 06/01/2022, #90, rx by Dr. Arlice Colt. No red flags.   Past Medical History:  Diagnosis Date   Anxiety and depression    Carpal tunnel syndrome, bilateral    Cervical spinal stenosis    Chronic sinusitis    noted on MR imaging for MS   Colon cancer screening    Cologuard NEG 05/07/21   Ganglion cyst    R wrist   History of basal cell carcinoma (BCC) of skin    multiple sites   History of SCC (squamous cell carcinoma) of skin    multiple sites   Hypercholesterolemia    Hypertension    Multiple sclerosis ([place])    Dx'd 1998 betaseron helped but caused skin necrosis in arms.  Has been in longterm remission OFF of DMT as of 2023.   Osteoarthritis, multiple sites    c spine and L thumb   Restless legs     Past Surgical History:  Procedure Laterality Date   25 GAUGE PARS PLANA VITRECTOMY WITH 20 GAUGE MVR PORT FOR MACULAR HOLE Left 04/06/2016   Procedure: 25 GAUGE PARS PLANA VITRECTOMY WITH 20 GAUGE MVR PORT FOR MACULAR HOLE;  Surgeon: Hayden Pedro, MD;  Location: [place];  Service: Ophthalmology;  Laterality: Left;   CATARACT EXTRACTION Left    COLONOSCOPY     2011   GAS/FLUID EXCHANGE Left 04/06/2016   Procedure: GAS/FLUID EXCHANGE;  Surgeon: Hayden Pedro,  MD;  Location: [place];  Service: Ophthalmology;  Laterality: Left;   LASER PHOTO ABLATION Left 04/06/2016   Procedure: LASER PHOTO ABLATION;  Surgeon: Hayden Pedro, MD;  Location: [place];  Service: Ophthalmology;  Laterality: Left;   MEMBRANE PEEL Left 04/06/2016   Procedure: MEMBRANE PEEL;  Surgeon: Hayden Pedro, MD;  Location: [place];  Service: Ophthalmology;  Laterality: Left;   NASAL SINUS SURGERY     PARS PLANA VITRECTOMY W/ REPAIR OF MACULAR HOLE Left 04/06/2016   RETINAL DETACHMENT SURGERY     SERUM PATCH Left 04/06/2016   Procedure: SERUM PATCH;  Surgeon: Hayden Pedro, MD;  Location: [place];  Service: Ophthalmology;  Laterality: Left;   TUBAL LIGATION      Family History  Problem Relation Age of Onset   Throat cancer Mother    Alcoholism Father    Heart disease Father    Stroke Father    Diabetes Sister    Diabetes Brother    Breast cancer Neg Hx     Social History   Socioeconomic History   Marital status: Divorced    Spouse name: Not on file   Number of children: 2   Years of education: Not on file   Highest education level: High school graduate  Occupational History  Not on file  Tobacco Use   Smoking status: Never   Smokeless tobacco: Never  Substance and Sexual Activity   Alcohol use: Yes    Comment: 04/06/2016 "might have 1 glass of wine/month, if that"   Drug use: No   Sexual activity: Never  Other Topics Concern   Not on file  Social History Narrative   Divorced, 2 children, 3 grandchildren.   She is from [place].   Educ: HS   Disability at one point d/t MS   No tob/alc   Social Determinants of Health   Financial Resource Strain: Not on file  Food Insecurity: Not on file  Transportation Needs: Not on file  Physical Activity: Not on file  Stress: Not on file  Social Connections: Not on file  Intimate Partner Violence: Not on file    Outpatient Encounter Medications as of 07/15/2022  Medication Sig   acetaminophen (TYLENOL) 325 MG  tablet Take 650 mg by mouth every 6 (six) hours as needed for mild pain.   ALPRAZolam (XANAX) 0.5 MG tablet One po tid   amLODipine (NORVASC) 5 MG tablet Take 5 mg by mouth daily.   Cholecalciferol (D3 VITAMIN PO) Take by mouth daily.   Citalopram Hydrobromide 30 MG CAPS 1 tab po qd   Cyanocobalamin (B-12 PO) Take by mouth daily.   hydrochlorothiazide (HYDRODIURIL) 25 MG tablet Take 12.5 mg by mouth daily.   lisinopril (PRINIVIL,ZESTRIL) 40 MG tablet Take 20 mg by mouth daily.   potassium chloride (KLOR-CON) 20 MEQ packet Take 40 mEq by mouth daily.   rosuvastatin (CRESTOR) 20 MG tablet Take 20 mg by mouth daily.   tiZANidine (ZANAFLEX) 4 MG capsule Take 4 mg by mouth daily.   VITAMIN C, CALCIUM ASCORBATE, PO Take by mouth daily.   [DISCONTINUED] Citalopram Hydrobromide 30 MG CAPS Take 30 mg by mouth daily.   dorzolamide (TRUSOPT) 2 % ophthalmic solution Place 1 drop into the left eye 3 (three) times daily. (Patient not taking: Reported on 07/15/2022)   gatifloxacin (ZYMAXID) 0.5 % SOLN Place 1 drop into the left eye 4 (four) times daily. (Patient not taking: Reported on 07/15/2022)   No facility-administered encounter medications on file as of 07/15/2022.    Allergies  Allergen Reactions   Sulfonamide Derivatives Rash and Other (See Comments)    DIFFICULTY SWALLOWING     Review of Systems  Constitutional:  Negative for fatigue and fever.  HENT:  Negative for congestion and sore throat.   Eyes:  Negative for visual disturbance.  Respiratory:  Negative for cough.   Cardiovascular:  Negative for chest pain.  Gastrointestinal:  Negative for abdominal pain and nausea.  Genitourinary:  Negative for dysuria.  Musculoskeletal:  Negative for back pain and joint swelling.  Skin:  Negative for rash.  Neurological:  Negative for weakness and headaches.  Hematological:  Negative for adenopathy.    PE; Blood pressure 135/77, pulse 70, temperature 98.1 F (36.7 C), height _0  (1.575 m),  weight 130 lb (59 kg), SpO2 100 %.Body mass index is 23.78 kg/m.   Physical Exam  Gen: Alert, well appearing.  Patient is oriented to person, place, time, and situation. AFFECT: pleasant, lucid thought and speech. CV: RRR, no m/r/g.   LUNGS: CTA bilat, nonlabored resps, good aeration in all lung fields. EXT: no clubbing or cyanosis.  no edema.   Pertinent labs:  Last CBC Lab Results  Component Value Date   WBC 5.8 04/06/2016   HGB 13.9 04/06/2016   HCT  42.1 04/06/2016   MCV 94.8 04/06/2016   MCH 31.3 04/06/2016   RDW 13.0 04/06/2016   PLT 225 04/54/0981   Last metabolic panel Lab Results  Component Value Date   GLUCOSE 95 04/06/2016   NA 145 04/27/2021   K 4.4 04/27/2021   CL 104 04/27/2021   CO2 25 (A) 04/27/2021   BUN 11 04/27/2021   CREATININE 0.9 04/27/2021   EGFR 72 04/27/2021   CALCIUM 9.4 04/27/2021   ALBUMIN 4.5 04/27/2021   ALKPHOS 95 04/27/2021   AST 20 04/27/2021   ALT 24 04/27/2021   ANIONGAP 6 04/06/2016   Last lipids Lab Results  Component Value Date   CHOL 189 04/27/2021   HDL 76 (A) 04/27/2021   LDLCALC 95 04/27/2021   TRIG 100 04/27/2021   Last vitamin D Lab Results  Component Value Date   VD25OH 63.8 04/17/2020   ASSESSMENT AND PLAN:   New patient, establishing care.  #1 hypertension, well controlled on amlodipine 5 mg a day, HCTZ 12.5 mg a day, and lisinopril 20 mg a day. She is also on 40 mill equivalents of potassium supplement daily. Labs from September by prior PCP were reviewed today.  Complete metabolic panel normal, specifically serum creatinine 0.83 and potassium 4.0.  #2 GAD. Significantly improved with relatively recent increase in her citalopram to 30 mg a day. We will continue this dose.  She has alprazolam to use as needed and this is prescribed by her neurologist.  #3 hyperlipidemia, doing well on Crestor 20 mg a day. Lipid panel done in September this year showed total cholesterol 174, triglycerides 86, HDL 81, LDL  77.  #4 history of squamous cell carcinoma and basal cell carcinoma of the skin. No new lesions. She requests referral to new dermatologist because hers retired.  #5 preventative health care: Vaccines: Flu and pneumonia UTD.  Shingrix prescription sent to pharmacy. Labs: none today-> CBC, c-Met, and lipid panel all normal September this year.  Cervical ca screening: Pap UTD this year by GYN MD-->Dr. Evie Lacks. Breast ca screening: Mammogram at [facility] center in [place] this year, normal. Colon ca screening: Cologuard negative 04/2021.  An After Visit Summary was printed and given to the patient.  Return in about 6 months (around 01/13/2023) for routine chronic illness f/u.  Signed:  Crissie Sickles, MD           07/15/2022

## 2022-07-15 ENCOUNTER — Ambulatory Visit (INDEPENDENT_AMBULATORY_CARE_PROVIDER_SITE_OTHER): Payer: Medicare Other | Admitting: Family Medicine

## 2022-07-15 ENCOUNTER — Encounter: Payer: Self-pay | Admitting: Family Medicine

## 2022-07-15 VITALS — BP 135/77 | HR 70 | Temp 98.1°F | Ht 62.0 in | Wt 130.0 lb

## 2022-07-15 DIAGNOSIS — E7800 Pure hypercholesterolemia, unspecified: Secondary | ICD-10-CM

## 2022-07-15 DIAGNOSIS — Z0000 Encounter for general adult medical examination without abnormal findings: Secondary | ICD-10-CM

## 2022-07-15 DIAGNOSIS — C4491 Basal cell carcinoma of skin, unspecified: Secondary | ICD-10-CM

## 2022-07-15 DIAGNOSIS — F411 Generalized anxiety disorder: Secondary | ICD-10-CM

## 2022-07-15 DIAGNOSIS — I10 Essential (primary) hypertension: Secondary | ICD-10-CM | POA: Diagnosis not present

## 2022-07-15 MED ORDER — CITALOPRAM HYDROBROMIDE 30 MG PO CAPS: ORAL_CAPSULE | ORAL | 1 refills | Status: DC

## 2022-07-15 MED ORDER — SHINGRIX 50 MCG/0.5ML IM SUSR: 0.5000 mL | Freq: Once | INTRAMUSCULAR | 1 refills | Status: AC

## 2022-07-19 ENCOUNTER — Telehealth: Payer: Self-pay

## 2022-07-19 MED ORDER — CITALOPRAM HYDROBROMIDE 20 MG PO TABS: ORAL_TABLET | ORAL | 1 refills | Status: DC

## 2022-07-19 NOTE — Telephone Encounter (Signed)
Please advise on changes.

## 2022-07-19 NOTE — Telephone Encounter (Signed)
Okay, citalopram tabs prescribed

## 2022-07-19 NOTE — Telephone Encounter (Signed)
Patient calling in regards to medication cost of  Citalopram capsules.  Patient stated she was taking tablets prior to change. Cost of capsules in over $75.00 monthly. Patient requesting to go back on tablets please.  Please advise [phone]

## 2022-07-20 NOTE — Telephone Encounter (Signed)
LVM for pt regarding medication.   

## 2022-07-21 ENCOUNTER — Other Ambulatory Visit: Payer: Self-pay | Admitting: Neurology

## 2022-07-21 NOTE — Telephone Encounter (Signed)
Pharmacy has cancelled prescription for capsules. Prescription for tablets is ready for pick up and has no charge. LVM for pt regarding medication.

## 2022-07-21 NOTE — Telephone Encounter (Signed)
If patient returns call, please notify prescription for citalopram tablets sent to pharmacy.

## 2022-08-03 ENCOUNTER — Encounter: Payer: Self-pay | Admitting: Neurology

## 2022-08-03 ENCOUNTER — Ambulatory Visit: Payer: Medicare Other | Admitting: Neurology

## 2022-08-03 VITALS — BP 151/86 | HR 77 | Ht 62.0 in | Wt 132.5 lb

## 2022-08-03 DIAGNOSIS — G35 Multiple sclerosis: Secondary | ICD-10-CM | POA: Diagnosis not present

## 2022-08-03 DIAGNOSIS — R4189 Other symptoms and signs involving cognitive functions and awareness: Secondary | ICD-10-CM | POA: Diagnosis not present

## 2022-08-03 DIAGNOSIS — F418 Other specified anxiety disorders: Secondary | ICD-10-CM

## 2022-08-03 DIAGNOSIS — I6381 Other cerebral infarction due to occlusion or stenosis of small artery: Secondary | ICD-10-CM

## 2022-08-03 NOTE — Progress Notes (Signed)
[facility]  PATIENT: Tracey Snow DOB: [birth year]  REFERRING DOCTOR OR PCP: Brayton Layman, MD [facility] neurology); Kip Corrington, MD(PCP) SOURCE: Patient, notes from Dr. Jacqulynn Cadet, imaging and lab reports, MRI images personally reviewed.  _________________________________   HISTORICAL  CHIEF COMPLAINT:  Chief Complaint  Patient presents with   Follow-up    Pt in room #11 and alone. Pt here today for f/u on her MS.    HISTORY OF PRESENT ILLNESS:  Tracey Snow is a 76 year old woman who was diagnosed with MS in 1998.   She has been off all DMTs since 2017.   She has had no exacerbations since discontinuing.   MRI 03/09/2022 showed no new lesions compared to 2020  Neurologically, she is stable.  She has reduced balance and can't stand on a chair or use heels.   She does water aerobics to try to stay active.  Gait is slightly worsening.   She uses the bannister on stairs.   She has no falls.   Her right leg is a little weaker than her left.    She denies much fatigue,   However, if she exercises she is tired afterwards.   She has some anxiety but not depression.  She noes mild cognitive issues wih reduced word finding.   She has some anxiety and is on Xanax 0.5 mg 3 pills most days. On bad days she may take 4 and takes 2 on a good day.    She has mild dysesthesias in the hands  Bladder function is stable.   She had a UTI in 2022.  She takes a diuretic and sometimes has mild urgency.    Her vision is stable.   No MS related issues but she has had cataracts and had a retinal issue.   She uses reading glasses.  BP was elevated today but ride in was hectic.   She reports checking BP regularly at home and it is fine.    MS history: She was diagnosed with MS in 1998 after presenting with numbness form the waist down.  MRIs were consistent with MS.   She was referred to Dr. Jacqulynn Cadet and Betaseron was started.     Her MS did ok but she did have a couple relapses,  one with poor balance and slurred speech.   She received IV Solu-medrol for these.  In her 9's she was stable.   She stopped Betaseron in 2017 due to skin necrosis in both arms.    She has had no exacerbations though has noted mild progression.    IMAGING:  MRI brain 03/16/2022 showed T2/FLAIR hyperintense foci in the cerebral hemispheres, pons and left thalamus in a pattern consistent with chronic demyelinating plaque associated with multiple sclerosis, intermixed with chronic microvascular ischemic change.  None of the foci appear to be acute though there has been progression compared to the 2006 MRI. Marland Kitchen   No change compared to the MRI from 03/23/2019.   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  MRI of the brain 03/23/2019 shows multiple T2/FLAIR hyperintense foci in the periventricular, juxtacortical and deep white matter.  A couple small foci are noted in the pons and thalamus.  None of the foci appear to be acute.  They do not enhance.  According to the radiologist report, there is no change compared to the previous MRI.  From 2017.  Additionally, the patient has chronic sinusitis.  MRI of the cervical spine 03/23/2019 shows degenerative changes  C4-C5 through C6-C7 with spinal stenosis and various degrees of foraminal narrowing..  The spinal cord has normal signal.  The radiologist report states relatively unchanged compared to 12/09/2015.       REVIEW OF SYSTEMS: Constitutional: No fevers, chills, sweats, or change in appetite Eyes: No visual changes, double vision, eye pain Ear, nose and throat: No hearing loss, ear pain, nasal congestion, sore throat Cardiovascular: No chest pain, palpitations Respiratory:  No shortness of breath at rest or with exertion.   No wheezes GastrointestinaI: No nausea, vomiting, diarrhea, abdominal pain, fecal incontinence Genitourinary:  No dysuria, urinary retention or frequency.  No nocturia. Musculoskeletal:  No neck pain, back  pain Integumentary: No rash, pruritus, skin lesions Neurological: as above Psychiatric: No depression at this time.  No anxiety Endocrine: No palpitations, diaphoresis, change in appetite, change in weigh or increased thirst Hematologic/Lymphatic:  No anemia, purpura, petechiae. Allergic/Immunologic: No itchy/runny eyes, nasal congestion, recent allergic reactions, rashes  ALLERGIES: Allergies  Allergen Reactions   Sulfonamide Derivatives Rash and Other (See Comments)    DIFFICULTY SWALLOWING    HOME MEDICATIONS:  Current Outpatient Medications:    acetaminophen (TYLENOL) 325 MG tablet, Take 650 mg by mouth every 6 (six) hours as needed for mild pain., Disp: , Rfl:    ALPRAZolam (XANAX) 0.5 MG tablet, TAKE 1 TABLET BY MOUTH THREE TIMES A DAY, Disp: 90 tablet, Rfl: 5   amLODipine (NORVASC) 5 MG tablet, Take 5 mg by mouth daily., Disp: , Rfl:    Cholecalciferol (D3 VITAMIN PO), Take by mouth daily., Disp: , Rfl:    citalopram (CELEXA) 20 MG tablet, 1 and 1/2 tabs po qd, Disp: 135 tablet, Rfl: 1   Cyanocobalamin (B-12 PO), Take by mouth daily., Disp: , Rfl:    hydrochlorothiazide (HYDRODIURIL) 25 MG tablet, Take 12.5 mg by mouth daily., Disp: , Rfl:    lisinopril (PRINIVIL,ZESTRIL) 40 MG tablet, Take 20 mg by mouth daily., Disp: , Rfl:    potassium chloride (KLOR-CON) 20 MEQ packet, Take 40 mEq by mouth daily., Disp: , Rfl:    rosuvastatin (CRESTOR) 20 MG tablet, Take 20 mg by mouth daily., Disp: , Rfl:    tiZANidine (ZANAFLEX) 4 MG capsule, Take 4 mg by mouth daily., Disp: , Rfl:    VITAMIN C, CALCIUM ASCORBATE, PO, Take by mouth daily., Disp: , Rfl:   PAST MEDICAL HISTORY: Past Medical History:  Diagnosis Date   Anxiety and depression    Carpal tunnel syndrome, bilateral    Cervical spinal stenosis    Chronic sinusitis    noted on MR imaging for MS   Colon cancer screening    Cologuard NEG 05/07/21   Ganglion cyst    R wrist   History of basal cell carcinoma (BCC) of skin     multiple sites   History of herpes zoster    back   History of SCC (squamous cell carcinoma) of skin    multiple sites   Hypercholesterolemia    Hypertension    Multiple sclerosis ([place])    Dx'd 1998 betaseron helped but caused skin necrosis in arms.  Has been in longterm remission OFF of DMT as of 2023.   Osteoarthritis, multiple sites    c spine and L thumb   Restless legs     PAST SURGICAL HISTORY: Past Surgical History:  Procedure Laterality Date   25 GAUGE PARS PLANA VITRECTOMY WITH 20 GAUGE MVR PORT FOR MACULAR HOLE Left 04/06/2016   Procedure: 25 GAUGE PARS PLANA VITRECTOMY WITH  20 GAUGE MVR PORT FOR MACULAR HOLE;  Surgeon: Hayden Pedro, MD;  Location: [facility];  Service: Ophthalmology;  Laterality: Left;   CATARACT EXTRACTION Left    COLONOSCOPY     2011   GAS/FLUID EXCHANGE Left 04/06/2016   Procedure: GAS/FLUID EXCHANGE;  Surgeon: Hayden Pedro, MD;  Location: [facility];  Service: Ophthalmology;  Laterality: Left;   LASER PHOTO ABLATION Left 04/06/2016   Procedure: LASER PHOTO ABLATION;  Surgeon: Hayden Pedro, MD;  Location: [facility];  Service: Ophthalmology;  Laterality: Left;   MEMBRANE PEEL Left 04/06/2016   Procedure: MEMBRANE PEEL;  Surgeon: Hayden Pedro, MD;  Location: [facility];  Service: Ophthalmology;  Laterality: Left;   NASAL SINUS SURGERY     PARS PLANA VITRECTOMY W/ REPAIR OF MACULAR HOLE Left 04/06/2016   RETINAL DETACHMENT SURGERY     SERUM PATCH Left 04/06/2016   Procedure: SERUM PATCH;  Surgeon: Hayden Pedro, MD;  Location: [facility];  Service: Ophthalmology;  Laterality: Left;   TUBAL LIGATION      FAMILY HISTORY: Family History  Problem Relation Age of Onset   Throat cancer Mother    Alcoholism Father    Heart disease Father    Stroke Father    Diabetes Sister    Diabetes Brother    Breast cancer Neg Hx     SOCIAL HISTORY:  Social History   Socioeconomic History   Marital status: Divorced    Spouse name: Not on file   Number of children:  2   Years of education: Not on file   Highest education level: High school graduate  Occupational History   Not on file  Tobacco Use   Smoking status: Never   Smokeless tobacco: Never  Substance and Sexual Activity   Alcohol use: Yes    Comment: 04/06/2016 "might have 1 glass of wine/month, if that"   Drug use: No   Sexual activity: Never  Other Topics Concern   Not on file  Social History Narrative   Divorced, 2 children, 3 grandchildren.   She is from [place].   Educ: HS   Disability at one point d/t MS   No tob/alc   Social Determinants of Health   Financial Resource Strain: Not on file  Food Insecurity: Not on file  Transportation Needs: Not on file  Physical Activity: Not on file  Stress: Not on file  Social Connections: Not on file  Intimate Partner Violence: Not on file     PHYSICAL EXAM  Vitals:   08/03/22 1103  BP: (!) 151/86  Pulse: 77  Weight: 132 lb 8 oz (60.1 kg)  Height: '5\' 2"'$  (1.575 m)    Body mass index is 24.23 kg/m.   General: The patient is well-developed and well-nourished and in no acute distress  HEENT:  Head is [place]/AT.  Sclera are anicteric.     Neck: The neck is nontender.but ROM is reduced  Skin: Extremities are without rash or  edema.  Neurologic Exam  Mental status: The patient is alert and oriented x 3 at the time of the examination. The patient has apparent normal recent and remote memory, with an apparently normal attention span and concentration ability.   Speech is normal.  Cranial nerves: Extraocular movements are full.  Facial strength and sensation was normal.. No obvious hearing deficits are noted.  Motor:  Muscle bulk is normal.   Tone is normal. Strength is  5 / 5 in all 4 extremities.   Sensory:  Sensory testing is intact to pinprick, soft touch and vibration sensation in all 4 extremities.  Coordination: Cerebellar testing reveals good finger-nose-finger and heel-to-shin bilaterally.  Gait and station: Station is  normal.   Gait is normal. Tandem gait is mildly wide but within normal limits for age. Romberg is negative.   Reflexes: Deep tendon reflexes are symmetric and normal bilaterally.   Plantar responses are flexor.    DIAGNOSTIC DATA (LABS, IMAGING, TESTING) - I reviewed patient records, labs, notes, testing and imaging myself where available.  Lab Results  Component Value Date   WBC 5.8 04/06/2016   HGB 13.9 04/06/2016   HCT 42.1 04/06/2016   MCV 94.8 04/06/2016   PLT 225 04/06/2016      Component Value Date/Time   NA 145 04/27/2021 0000   K 4.4 04/27/2021 0000   CL 104 04/27/2021 0000   CO2 25 (A) 04/27/2021 0000   GLUCOSE 95 04/06/2016 1017   BUN 11 04/27/2021 0000   CREATININE 0.9 04/27/2021 0000   CREATININE 0.94 04/06/2016 1017   CALCIUM 9.4 04/27/2021 0000   ALBUMIN 4.5 04/27/2021 0000   AST 20 04/27/2021 0000   ALT 24 04/27/2021 0000   ALKPHOS 95 04/27/2021 0000   GFRNONAA >60 04/06/2016 1017   GFRAA >60 04/06/2016 1017       ASSESSMENT AND PLAN  Multiple sclerosis (HCC)  Lacunar infarction (HCC)  Cognitive changes  Depression with anxiety    Her MS appears to be stable.  She has had no recent exacerbations.  The MRI showed no new lesions.  She can stay off disease modifying therapies. MRI showed superimposed chronic microvascular ischemic changes as well as a lacunar infarction at the head of the caudate (not present on the 2006 MRI but seen on the 2020 MRI).  I advised her to take 81 mg aspirin daily. Stay active and exercise as tolerated. Continue Xanax 3 times daily for anxiety.  Continue citalopram for anxiety  she will return to see [place] in 6 months or sooner if there are new or worsening symptoms.    [place] Ganoe A. Felecia Shelling, MD, [facility] 28/41/3244, 01:02 AM Certified in Neurology, Clinical Neurophysiology, Sleep Medicine and Neuroimaging  [facility] [address], [place], [place] [address] [phone]

## 2022-08-16 HISTORY — PX: SKIN CANCER EXCISION: SHX779

## 2022-08-19 ENCOUNTER — Telehealth: Payer: Self-pay | Admitting: Family Medicine

## 2022-08-19 NOTE — Telephone Encounter (Signed)
Pt called and wanted to inform us that she has a new insurance and her medications will need to be switched to mail order [facility]. She would like to continue to have Citalopram sent to the [facility] in [place], but everything else switched to the mail order pharmacy.  The fax number she provided is [phone]

## 2022-08-19 NOTE — Telephone Encounter (Signed)
Pharmacy added, last refill completed by Dr.McGowen (citalopram). Another provider prescribes her xanax

## 2022-08-24 NOTE — Telephone Encounter (Signed)
All medications pending for refill except alprazolam.   Please resend to mail order pharmacy with appropriate quantities.

## 2022-08-24 NOTE — Telephone Encounter (Signed)
Patient established with Dr. Anitra Lauth in November 2023. Per patient new insurance has to be sent to mail order pharmacy. Mail order pharmacy requests all new prescriptions to be sent / all placed on file until patient needs.  [place]  amLODipine (NORVASC) 5 MG tablet   citalopram (CELEXA) 20 MG tablet   hydrochlorothiazide (HYDRODIURIL) 25 MG tablet   lisinopril (PRINIVIL,ZESTRIL) 40 MG tablet   potassium chloride (KLOR-CON) 20 MEQ packet   rosuvastatin (CRESTOR) 20 MG tablet   tiZANidine (ZANAFLEX) 4 MG capsule

## 2022-08-25 MED ORDER — CITALOPRAM HYDROBROMIDE 20 MG PO TABS: ORAL_TABLET | ORAL | 1 refills | Status: DC

## 2022-08-25 MED ORDER — HYDROCHLOROTHIAZIDE 25 MG PO TABS: 12.5000 mg | ORAL_TABLET | Freq: Every day | ORAL | 1 refills | Status: DC

## 2022-08-25 MED ORDER — ROSUVASTATIN CALCIUM 20 MG PO TABS: 20.0000 mg | ORAL_TABLET | Freq: Every day | ORAL | 1 refills | Status: DC

## 2022-08-25 MED ORDER — TIZANIDINE HCL 4 MG PO CAPS: 4.0000 mg | ORAL_CAPSULE | Freq: Every day | ORAL | 1 refills | Status: DC

## 2022-08-25 MED ORDER — POTASSIUM CHLORIDE 20 MEQ PO PACK: 40.0000 meq | PACK | Freq: Every day | ORAL | 1 refills | Status: DC

## 2022-08-25 MED ORDER — AMLODIPINE BESYLATE 5 MG PO TABS: 5.0000 mg | ORAL_TABLET | Freq: Every day | ORAL | 1 refills | Status: DC

## 2022-08-25 MED ORDER — LISINOPRIL 40 MG PO TABS: 20.0000 mg | ORAL_TABLET | Freq: Every day | ORAL | 1 refills | Status: DC

## 2022-08-25 NOTE — Telephone Encounter (Signed)
LVM for pt to return call.  Note: if pt returns call, please inform all medications were resent to mail order.

## 2022-08-25 NOTE — Telephone Encounter (Signed)
Noted  

## 2022-08-25 NOTE — Telephone Encounter (Signed)
OK, all rx's sent

## 2022-08-25 NOTE — Telephone Encounter (Signed)
Patient returned call.  Patient aware of all rx's sent to [facility]

## 2022-09-22 ENCOUNTER — Ambulatory Visit: Payer: Medicare Other

## 2022-10-02 DIAGNOSIS — U071 COVID-19: Secondary | ICD-10-CM | POA: Diagnosis not present

## 2022-10-02 DIAGNOSIS — Z0100 Encounter for examination of eyes and vision without abnormal findings: Secondary | ICD-10-CM | POA: Diagnosis not present

## 2022-10-05 ENCOUNTER — Telehealth: Payer: Self-pay | Admitting: Family Medicine

## 2022-10-05 NOTE — Telephone Encounter (Signed)
Contacted Lora Paula to schedule their annual wellness visit. Appointment made for 10/06/2022.  Jay Direct Dial [phone]

## 2022-10-06 ENCOUNTER — Ambulatory Visit (INDEPENDENT_AMBULATORY_CARE_PROVIDER_SITE_OTHER): Payer: Medicare HMO

## 2022-10-06 VITALS — Wt 132.0 lb

## 2022-10-06 DIAGNOSIS — Z0000 Encounter for general adult medical examination without abnormal findings: Secondary | ICD-10-CM | POA: Diagnosis not present

## 2022-10-06 NOTE — Progress Notes (Signed)
I connected with  Lora Paula on 10/06/22 by a audio enabled telemedicine application and verified that I am speaking with the correct person using two identifiers.  Patient Location: Home  Provider Location: Home Office  I discussed the limitations of evaluation and management by telemedicine. The patient expressed understanding and agreed to proceed.   Subjective:   Tracey Snow is a 77 y.o. female who presents for Medicare Annual (Subsequent) preventive examination.  Review of Systems     Cardiac Risk Factors include: advanced age (>93mn, >>10women)     Objective:    Today's Vitals   10/06/22 1058  Weight: 132 lb (59.9 kg)   Body mass index is 24.14 kg/m.     10/06/2022   11:03 AM 04/06/2016    4:51 PM 04/06/2016   10:44 AM  Advanced Directives  Does Patient Have a Medical Advance Directive? Yes  Yes  Type of AParamedicof ABowlesLiving will  HDellLiving will  Copy of HHaverford Collegein Chart? No - copy requested No - copy requested Yes    Current Medications (verified) Outpatient Encounter Medications as of 10/06/2022  Medication Sig   acetaminophen (TYLENOL) 325 MG tablet Take 650 mg by mouth every 6 (six) hours as needed for mild pain.   ALPRAZolam (XANAX) 0.5 MG tablet TAKE 1 TABLET BY MOUTH THREE TIMES A DAY   amLODipine (NORVASC) 5 MG tablet Take 1 tablet (5 mg total) by mouth daily.   Cholecalciferol (D3 VITAMIN PO) Take by mouth daily.   citalopram (CELEXA) 20 MG tablet 1 and 1/2 tabs po qd   Cyanocobalamin (B-12 PO) Take by mouth daily.   hydrochlorothiazide (HYDRODIURIL) 25 MG tablet Take 0.5 tablets (12.5 mg total) by mouth daily.   lisinopril (ZESTRIL) 40 MG tablet Take 0.5 tablets (20 mg total) by mouth daily.   potassium chloride (KLOR-CON) 20 MEQ packet Take 40 mEq by mouth daily.   rosuvastatin (CRESTOR) 20 MG tablet Take 1 tablet (20 mg total) by mouth daily.   tiZANidine  (ZANAFLEX) 4 MG capsule Take 1 capsule (4 mg total) by mouth daily.   VITAMIN C, CALCIUM ASCORBATE, PO Take by mouth daily.   No facility-administered encounter medications on file as of 10/06/2022.    Allergies (verified) Sulfonamide derivatives   History: Past Medical History:  Diagnosis Date   Anxiety and depression    Carpal tunnel syndrome, bilateral    Cervical spinal stenosis    Chronic sinusitis    noted on MR imaging for MS   Colon cancer screening    Cologuard NEG 05/07/21   Ganglion cyst    R wrist   History of basal cell carcinoma (BCC) of skin    multiple sites   History of herpes zoster    back   History of SCC (squamous cell carcinoma) of skin    multiple sites   Hypercholesterolemia    Hypertension    Multiple sclerosis ([place]    Dx'd 1998 betaseron helped but caused skin necrosis in arms.  Has been in longterm remission OFF of DMT as of 2023.   Osteoarthritis, multiple sites    c spine and L thumb   Restless legs    Past Surgical History:  Procedure Laterality Date   25 GAUGE PARS PLANA VITRECTOMY WITH 20 GAUGE MVR PORT FOR MACULAR HOLE Left 04/06/2016   Procedure: 25 GAUGE PARS PLANA VITRECTOMY WITH 20 GAUGE MVR PORT FOR MACULAR HOLE;  Surgeon: JJenny Reichmann  Eber Jones, MD;  Location: [place];  Service: Ophthalmology;  Laterality: Left;   CATARACT EXTRACTION Left    COLONOSCOPY     2011   GAS/FLUID EXCHANGE Left 04/06/2016   Procedure: GAS/FLUID EXCHANGE;  Surgeon: Hayden Pedro, MD;  Location: [place];  Service: Ophthalmology;  Laterality: Left;   LASER PHOTO ABLATION Left 04/06/2016   Procedure: LASER PHOTO ABLATION;  Surgeon: Hayden Pedro, MD;  Location: [place];  Service: Ophthalmology;  Laterality: Left;   MEMBRANE PEEL Left 04/06/2016   Procedure: MEMBRANE PEEL;  Surgeon: Hayden Pedro, MD;  Location: [place];  Service: Ophthalmology;  Laterality: Left;   NASAL SINUS SURGERY     PARS PLANA VITRECTOMY W/ REPAIR OF MACULAR HOLE Left 04/06/2016   RETINAL  DETACHMENT SURGERY     SERUM PATCH Left 04/06/2016   Procedure: SERUM PATCH;  Surgeon: Hayden Pedro, MD;  Location: [place];  Service: Ophthalmology;  Laterality: Left;   TUBAL LIGATION     Family History  Problem Relation Age of Onset   Throat cancer Mother    Alcoholism Father    Heart disease Father    Stroke Father    Diabetes Sister    Diabetes Brother    Breast cancer Neg Hx    Social History   Socioeconomic History   Marital status: Divorced    Spouse name: Not on file   Number of children: 2   Years of education: Not on file   Highest education level: High school graduate  Occupational History   Not on file  Tobacco Use   Smoking status: Never   Smokeless tobacco: Never  Substance and Sexual Activity   Alcohol use: Yes    Comment: 04/06/2016 "might have 1 glass of wine/month, if that"   Drug use: No   Sexual activity: Never  Other Topics Concern   Not on file  Social History Narrative   Divorced, 2 children, 3 grandchildren.   She is from [place].   Educ: HS   Disability at one point d/t MS   No tob/alc   Social Determinants of Health   Financial Resource Strain: Low Risk  (10/06/2022)   Overall Financial Resource Strain (CARDIA)    Difficulty of Paying Living Expenses: Not hard at all  Food Insecurity: No Food Insecurity (10/06/2022)   Hunger Vital Sign    Worried About Running Out of Food in the Last Year: Never true    Ran Out of Food in the Last Year: Never true  Transportation Needs: No Transportation Needs (10/06/2022)   PRAPARE - Hydrologist (Medical): No    Lack of Transportation (Non-Medical): No  Physical Activity: Sufficiently Active (10/06/2022)   Exercise Vital Sign    Days of Exercise per Week: 3 days    Minutes of Exercise per Session: 60 min  Stress: No Stress Concern Present (10/06/2022)   [place]    Feeling of Stress : Not at all  Social  Connections: Moderately Isolated (10/06/2022)   Social Connection and Isolation Panel [NHANES]    Frequency of Communication with Friends and Family: More than three times a week    Frequency of Social Gatherings with Friends and Family: More than three times a week    Attends Religious Services: More than 4 times per year    Active Member of [employer] or Organizations: No    Attends Archivist Meetings: Never  Marital Status: Divorced    Tobacco Counseling Counseling given: Not Answered   Clinical Intake:  Pre-visit preparation completed: Yes  Pain : No/denies pain     BMI - recorded: 24.14 Nutritional Status: BMI of 19-24  Normal Nutritional Risks: None Diabetes: No  How often do you need to have someone help you when you read instructions, pamphlets, or other written materials from your doctor or pharmacy?: 1 - Never  Diabetic?no  Interpreter Needed?: No  Information entered by :: Charlott Rakes, LPN   Activities of Daily Living    10/06/2022   11:05 AM  In your present state of health, do you have any difficulty performing the following activities:  Hearing? 0  Vision? 0  Difficulty concentrating or making decisions? 0  Walking or climbing stairs? 0  Dressing or bathing? 0  Doing errands, shopping? 0  Preparing Food and eating ? N  Using the Toilet? N  In the past six months, have you accidently leaked urine? N  Do you have problems with loss of bowel control? N  Managing your Medications? N  Managing your Finances? N  Housekeeping or managing your Housekeeping? N    Patient Care Team: Tammi Sou, MD as PCP - General (Family Medicine) Felecia Shelling, Nanine Means, MD as Consulting Physician (Neurology) Gari Crown, MD as Consulting Physician (Obstetrics and Gynecology) Hayden Pedro, MD as Consulting Physician (Ophthalmology)  Indicate any recent Medical Services you may have received from other than [facility] providers in the past year (date may be  approximate).     Assessment:   This is a routine wellness examination for Illyanna.  Hearing/Vision screen Hearing Screening - Comments:: Pt denies any hearing issues  Vision Screening - Comments:: Pt follows up with Dr Zigmund Daniel for annual eye exams   Dietary issues and exercise activities discussed: Current Exercise Habits: Home exercise routine, Type of exercise: Other - see comments (water aerobics)   Goals Addressed             This Visit's Progress    Patient Stated       Stay healthy        Depression Screen    10/06/2022   11:02 AM 07/15/2022   10:03 AM  PHQ 2/9 Scores  PHQ - 2 Score 1 2    Fall Risk    10/06/2022   11:05 AM 07/15/2022   10:04 AM  Fall Risk   Falls in the past year? 0 0  Number falls in past yr: 0 0  Injury with Fall? 0 0  Risk for fall due to : Impaired vision;Impaired balance/gait No Fall Risks  Follow up Falls prevention discussed     FALL RISK PREVENTION PERTAINING TO THE HOME:  Any stairs in or around the home? No  If so, are there any without handrails? No  Home free of loose throw rugs in walkways, pet beds, electrical cords, etc? Yes  Adequate lighting in your home to reduce risk of falls? Yes   ASSISTIVE DEVICES UTILIZED TO PREVENT FALLS:  Life alert? No  Use of a cane, walker or w/c? No  Grab bars in the bathroom? Yes  Shower chair or bench in shower? No  Elevated toilet seat or a handicapped toilet? No   TIMED UP AND GO:  Was the test performed? No .   Cognitive Function:        10/06/2022   11:06 AM  6CIT Screen  What Year? 0 points  What month? 0 points  What time? 0 points  Count back from 20 0 points  Months in reverse 0 points  Repeat phrase 0 points  Total Score 0 points    Immunizations Immunization History  Administered Date(s) Administered   Fluad Quad(high Dose 65+) 05/06/2022   Influenza,inj,Quad PF,6+ Mos 07/01/2014   Influenza-Unspecified 07/01/2014, 07/21/2020   PNEUMOCOCCAL  CONJUGATE-20 05/06/2022    TDAP status: Due, Education has been provided regarding the importance of this vaccine. Advised may receive this vaccine at local pharmacy or Health Dept. Aware to provide a copy of the vaccination record if obtained from local pharmacy or Health Dept. Verbalized acceptance and understanding.  Flu Vaccine status: Up to date  Pneumococcal vaccine status: Up to date  Covid-19 vaccine status: Information provided on how to obtain vaccines.   Qualifies for Shingles Vaccine? Yes   Zostavax completed No   Shingrix Completed?: No.    Education has been provided regarding the importance of this vaccine. Patient has been advised to call insurance company to determine out of pocket expense if they have not yet received this vaccine. Advised may also receive vaccine at local pharmacy or Health Dept. Verbalized acceptance and understanding.  Screening Tests Health Maintenance  Topic Date Due   COVID-19 Vaccine (1) Never done   Hepatitis C Screening  Never done   DTaP/Tdap/Td (1 - Tdap) Never done   Zoster Vaccines- Shingrix (1 of 2) Never done   DEXA SCAN  Never done   Medicare Annual Wellness (AWV)  10/07/2023   COLONOSCOPY (Pts 45-25yr Insurance coverage will need to be confirmed)  07/17/2031   Pneumonia Vaccine 76 Years old  Completed   INFLUENZA VACCINE  Completed   HPV VACCINES  Aged Out    Health Maintenance  Health Maintenance Due  Topic Date Due   COVID-19 Vaccine (1) Never done   Hepatitis C Screening  Never done   DTaP/Tdap/Td (1 - Tdap) Never done   Zoster Vaccines- Shingrix (1 of 2) Never done   DEXA SCAN  Never done    Colorectal cancer screening: Type of screening: Colonoscopy. Completed 07/16/21. Repeat every 10 years  Mammogram status: Completed 03/06/20. Repeat every year     Additional Screening:  Hepatitis C Screening: does qualify;  Vision Screening: Recommended annual ophthalmology exams for early detection of glaucoma and other  disorders of the eye. Is the patient up to date with their annual eye exam?  Yes  Who is the provider or what is the name of the office in which the patient attends annual eye exams? Dr MZigmund DanielIf pt is not established with a provider, would they like to be referred to a provider to establish care? No .   Dental Screening: Recommended annual dental exams for proper oral hygiene  Community Resource Referral / Chronic Care Management: CRR required this visit?  No   CCM required this visit?  No      Plan:     I have personally reviewed and noted the following in the patient's chart:   Medical and social history Use of alcohol, tobacco or illicit drugs  Current medications and supplements including opioid prescriptions. Patient is not currently taking opioid prescriptions. Functional ability and status Nutritional status Physical activity Advanced directives List of other physicians Hospitalizations, surgeries, and ER visits in previous 12 months Vitals Screenings to include cognitive, depression, and falls Referrals and appointments  In addition, I have reviewed and discussed with patient certain preventive protocols, quality metrics, and best practice recommendations. A written personalized  care plan for preventive services as well as general preventive health recommendations were provided to patient.     Willette Brace, LPN   [id number]   Nurse Notes: none

## 2022-10-06 NOTE — Patient Instructions (Signed)
[name] , Thank you for taking time to come for your Medicare Wellness Visit. I appreciate your ongoing commitment to your health goals. Please review the following plan we discussed and let me know if I can assist you in the future.   These are the goals we discussed:  Goals   None     This is a list of the screening recommended for you and due dates:  Health Maintenance  Topic Date Due   COVID-19 Vaccine (1) Never done   Hepatitis C Screening: USPSTF Recommendation to screen - Ages 76-79 yo.  Never done   DTaP/Tdap/Td vaccine (1 - Tdap) Never done   Zoster (Shingles) Vaccine (1 of 2) Never done   DEXA scan (bone density measurement)  Never done   Medicare Annual Wellness Visit  10/07/2023   Colon Cancer Screening  07/17/2031   Pneumonia Vaccine  Completed   Flu Shot  Completed   HPV Vaccine  Aged Out    Advanced directives: Please bring a copy of your health care power of attorney and living will to the office at your convenience.  Conditions/risks identified: c  Next appointment: Follow up in one year for your annual wellness visit    Preventive Care 65 Years and Older, Female Preventive care refers to lifestyle choices and visits with your health care provider that can promote health and wellness. What does preventive care include? A yearly physical exam. This is also called an annual well check. Dental exams once or twice a year. Routine eye exams. Ask your health care provider how often you should have your eyes checked. Personal lifestyle choices, including: Daily care of your teeth and gums. Regular physical activity. Eating a healthy diet. Avoiding tobacco and drug use. Limiting alcohol use. Practicing safe sex. Taking low-dose aspirin every day. Taking vitamin and mineral supplements as recommended by your health care provider. What happens during an annual well check? The services and screenings done by your health care provider during your annual well  check will depend on your age, overall health, lifestyle risk factors, and family history of disease. Counseling  Your health care provider may ask you questions about your: Alcohol use. Tobacco use. Drug use. Emotional well-being. Home and relationship well-being. Sexual activity. Eating habits. History of falls. Memory and ability to understand (cognition). Work and work Statistician. Reproductive health. Screening  You may have the following tests or measurements: Height, weight, and BMI. Blood pressure. Lipid and cholesterol levels. These may be checked every 5 years, or more frequently if you are over 76 years old. Skin check. Lung cancer screening. You may have this screening every year starting at age 76 if you have a 30-pack-year history of smoking and currently smoke or have quit within the past 15 years. Fecal occult blood test (FOBT) of the stool. You may have this test every year starting at age 76. Flexible sigmoidoscopy or colonoscopy. You may have a sigmoidoscopy every 5 years or a colonoscopy every 10 years starting at age 76. Hepatitis C blood test. Hepatitis B blood test. Sexually transmitted disease (STD) testing. Diabetes screening. This is done by checking your blood sugar (glucose) after you have not eaten for a while (fasting). You may have this done every 1-3 years. Bone density scan. This is done to screen for osteoporosis. You may have this done starting at age 76. Mammogram. This may be done every 1-2 years. Talk to your health care provider about how often you should have regular mammograms. Talk  with your health care provider about your test results, treatment options, and if necessary, the need for more tests. Vaccines  Your health care provider may recommend certain vaccines, such as: Influenza vaccine. This is recommended every year. Tetanus, diphtheria, and acellular pertussis (Tdap, Td) vaccine. You may need a Td booster every 10 years. Zoster  vaccine. You may need this after age 76. Pneumococcal 13-valent conjugate (PCV13) vaccine. One dose is recommended after age 76. Pneumococcal polysaccharide (PPSV23) vaccine. One dose is recommended after age 76. Talk to your health care provider about which screenings and vaccines you need and how often you need them. This information is not intended to replace advice given to you by your health care provider. Make sure you discuss any questions you have with your health care provider. Document Released: 08/29/2015 Document Revised: 04/21/2016 Document Reviewed: 06/03/2015 Elsevier Interactive Patient Education  2017 [place] Prevention in the Home Falls can cause injuries. They can happen to people of all ages. There are many things you can do to make your home safe and to help prevent falls. What can I do on the outside of my home? Regularly fix the edges of walkways and driveways and fix any cracks. Remove anything that might make you trip as you walk through a door, such as a raised step or threshold. Trim any bushes or trees on the path to your home. Use bright outdoor lighting. Clear any walking paths of anything that might make someone trip, such as rocks or tools. Regularly check to see if handrails are loose or broken. Make sure that both sides of any steps have handrails. Any raised decks and porches should have guardrails on the edges. Have any leaves, snow, or ice cleared regularly. Use sand or salt on walking paths during winter. Clean up any spills in your garage right away. This includes oil or grease spills. What can I do in the bathroom? Use night lights. Install grab bars by the toilet and in the tub and shower. Do not use towel bars as grab bars. Use non-skid mats or decals in the tub or shower. If you need to sit down in the shower, use a plastic, non-slip stool. Keep the floor dry. Clean up any water that spills on the floor as soon as it happens. Remove  soap buildup in the tub or shower regularly. Attach bath mats securely with double-sided non-slip rug tape. Do not have throw rugs and other things on the floor that can make you trip. What can I do in the bedroom? Use night lights. Make sure that you have a light by your bed that is easy to reach. Do not use any sheets or blankets that are too big for your bed. They should not hang down onto the floor. Have a firm chair that has side arms. You can use this for support while you get dressed. Do not have throw rugs and other things on the floor that can make you trip. What can I do in the kitchen? Clean up any spills right away. Avoid walking on wet floors. Keep items that you use a lot in easy-to-reach places. If you need to reach something above you, use a strong step stool that has a grab bar. Keep electrical cords out of the way. Do not use floor polish or wax that makes floors slippery. If you must use wax, use non-skid floor wax. Do not have throw rugs and other things on the floor that can make you  trip. What can I do with my stairs? Do not leave any items on the stairs. Make sure that there are handrails on both sides of the stairs and use them. Fix handrails that are broken or loose. Make sure that handrails are as long as the stairways. Check any carpeting to make sure that it is firmly attached to the stairs. Fix any carpet that is loose or worn. Avoid having throw rugs at the top or bottom of the stairs. If you do have throw rugs, attach them to the floor with carpet tape. Make sure that you have a light switch at the top of the stairs and the bottom of the stairs. If you do not have them, ask someone to add them for you. What else can I do to help prevent falls? Wear shoes that: Do not have high heels. Have rubber bottoms. Are comfortable and fit you well. Are closed at the toe. Do not wear sandals. If you use a stepladder: Make sure that it is fully opened. Do not climb a  closed stepladder. Make sure that both sides of the stepladder are locked into place. Ask someone to hold it for you, if possible. Clearly mark and make sure that you can see: Any grab bars or handrails. First and last steps. Where the edge of each step is. Use tools that help you move around (mobility aids) if they are needed. These include: Canes. Walkers. Scooters. Crutches. Turn on the lights when you go into a dark area. Replace any light bulbs as soon as they burn out. Set up your furniture so you have a clear path. Avoid moving your furniture around. If any of your floors are uneven, fix them. If there are any pets around you, be aware of where they are. Review your medicines with your doctor. Some medicines can make you feel dizzy. This can increase your chance of falling. Ask your doctor what other things that you can do to help prevent falls. This information is not intended to replace advice given to you by your health care provider. Make sure you discuss any questions you have with your health care provider. Document Released: 05/29/2009 Document Revised: 01/08/2016 Document Reviewed: 09/06/2014 Elsevier Interactive Patient Education  2017 [employer].

## 2022-11-22 ENCOUNTER — Encounter: Payer: Self-pay | Admitting: Family Medicine

## 2022-11-22 ENCOUNTER — Ambulatory Visit (INDEPENDENT_AMBULATORY_CARE_PROVIDER_SITE_OTHER): Payer: Medicare HMO | Admitting: Family Medicine

## 2022-11-22 VITALS — BP 144/83 | HR 69 | Wt 137.0 lb

## 2022-11-22 DIAGNOSIS — R8290 Unspecified abnormal findings in urine: Secondary | ICD-10-CM

## 2022-11-22 DIAGNOSIS — N3001 Acute cystitis with hematuria: Secondary | ICD-10-CM | POA: Diagnosis not present

## 2022-11-22 LAB — POC URINALSYSI DIPSTICK (AUTOMATED)
Bilirubin, UA: NEGATIVE
Glucose, UA: NEGATIVE
Ketones, UA: NEGATIVE
Leukocytes, UA: NEGATIVE
Nitrite, UA: NEGATIVE
Protein, UA: NEGATIVE
Spec Grav, UA: 1.015 (ref 1.010–1.025)
Urobilinogen, UA: 0.2 E.U./dL
pH, UA: 6 (ref 5.0–8.0)

## 2022-11-22 MED ORDER — NITROFURANTOIN MONOHYD MACRO 100 MG PO CAPS: ORAL_CAPSULE | ORAL | 0 refills | Status: DC

## 2022-11-22 NOTE — Progress Notes (Signed)
OFFICE VISIT  11/22/2022  CC:  Chief Complaint  Patient presents with   Acute Visit    Urine is dark and has an odor that's been going on for a few weeks.    Patient is a 77 y.o. female who presents for urine dark and malodorous  HPI: Approximately 2 to 3-week history of gradually building malodorous urine, urine darker yellow/cloudy, and urinary frequency.  No dysuria, no flank pain, no gross hematuria, no abdominal pain or nausea.  She reports having about 2 urinary tract infections within the last 12 to 18 months.  She has been to an urgent care in [place]/made an for each of these.  She states that each of these cleared up with the initial antibiotic but she does not recall which one.  Past Medical History:  Diagnosis Date   Anxiety and depression    Carpal tunnel syndrome, bilateral    Cervical spinal stenosis    Chronic sinusitis    noted on MR imaging for MS   Colon cancer screening    Cologuard NEG 05/07/21   Ganglion cyst    R wrist   History of basal cell carcinoma (BCC) of skin    multiple sites   History of herpes zoster    back   History of SCC (squamous cell carcinoma) of skin    multiple sites   Hypercholesterolemia    Hypertension    Multiple sclerosis    Dx'd 1998 betaseron helped but caused skin necrosis in arms.  Has been in longterm remission OFF of DMT as of 2023.   Osteoarthritis, multiple sites    c spine and L thumb   Restless legs     Past Surgical History:  Procedure Laterality Date   25 GAUGE PARS PLANA VITRECTOMY WITH 20 GAUGE MVR PORT FOR MACULAR HOLE Left 04/06/2016   Procedure: 25 GAUGE PARS PLANA VITRECTOMY WITH 20 GAUGE MVR PORT FOR MACULAR HOLE;  Surgeon: Sherrie George, MD;  Location: [facility] OR;  Service: Ophthalmology;  Laterality: Left;   CATARACT EXTRACTION Left    COLONOSCOPY     2011   GAS/FLUID EXCHANGE Left 04/06/2016   Procedure: GAS/FLUID EXCHANGE;  Surgeon: Sherrie George, MD;  Location: [facility] OR;  Service: Ophthalmology;   Laterality: Left;   LASER PHOTO ABLATION Left 04/06/2016   Procedure: LASER PHOTO ABLATION;  Surgeon: Sherrie George, MD;  Location: [facility] OR;  Service: Ophthalmology;  Laterality: Left;   MEMBRANE PEEL Left 04/06/2016   Procedure: MEMBRANE PEEL;  Surgeon: Sherrie George, MD;  Location: [facility] OR;  Service: Ophthalmology;  Laterality: Left;   NASAL SINUS SURGERY     PARS PLANA VITRECTOMY W/ REPAIR OF MACULAR HOLE Left 04/06/2016   RETINAL DETACHMENT SURGERY     SERUM PATCH Left 04/06/2016   Procedure: SERUM PATCH;  Surgeon: Sherrie George, MD;  Location: [facility] OR;  Service: Ophthalmology;  Laterality: Left;   TUBAL LIGATION      Outpatient Medications Prior to Visit  Medication Sig Dispense Refill   acetaminophen (TYLENOL) 325 MG tablet Take 650 mg by mouth every 6 (six) hours as needed for mild pain.     ALPRAZolam (XANAX) 0.5 MG tablet TAKE 1 TABLET BY MOUTH THREE TIMES A DAY 90 tablet 5   amLODipine (NORVASC) 5 MG tablet Take 1 tablet (5 mg total) by mouth daily. 90 tablet 1   Cholecalciferol (D3 VITAMIN PO) Take by mouth daily.     citalopram (CELEXA) 20 MG tablet 1 and 1/2  tabs po qd 135 tablet 1   Cyanocobalamin (B-12 PO) Take by mouth daily.     hydrochlorothiazide (HYDRODIURIL) 25 MG tablet Take 0.5 tablets (12.5 mg total) by mouth daily. 90 tablet 1   lisinopril (ZESTRIL) 40 MG tablet Take 0.5 tablets (20 mg total) by mouth daily. 90 tablet 1   potassium chloride (KLOR-CON) 20 MEQ packet Take 40 mEq by mouth daily. 180 each 1   rosuvastatin (CRESTOR) 20 MG tablet Take 1 tablet (20 mg total) by mouth daily. 90 tablet 1   tiZANidine (ZANAFLEX) 4 MG capsule Take 1 capsule (4 mg total) by mouth daily. 90 capsule 1   VITAMIN C, CALCIUM ASCORBATE, PO Take by mouth daily.     No facility-administered medications prior to visit.    Allergies  Allergen Reactions   Sulfonamide Derivatives Rash and Other (See Comments)    DIFFICULTY SWALLOWING    Review of Systems  As per HPI  PE:     11/22/2022    1:54 PM 10/06/2022   10:58 AM 08/03/2022   11:03 AM  Vitals with BMI  Height   5\' 2"   Weight 137 lbs 132 lbs 132 lbs 8 oz  BMI   24.23  Systolic 144  151  Diastolic 83  86  Pulse 69  77     Physical Exam  Gen: Alert, well appearing.  Patient is oriented to person, place, time, and situation. AFFECT: pleasant, lucid thought and speech. No further exam today  LABS:  Last CBC Lab Results  Component Value Date   WBC 5.8 04/06/2016   HGB 13.9 04/06/2016   HCT 42.1 04/06/2016   MCV 94.8 04/06/2016   MCH 31.3 04/06/2016   RDW 13.0 04/06/2016   PLT 225 04/06/2016   Last metabolic panel Lab Results  Component Value Date   GLUCOSE 95 04/06/2016   NA 145 04/27/2021   K 4.4 04/27/2021   CL 104 04/27/2021   CO2 25 (A) 04/27/2021   BUN 11 04/27/2021   CREATININE 0.9 04/27/2021   EGFR 72 04/27/2021   CALCIUM 9.4 04/27/2021   ALBUMIN 4.5 04/27/2021   ALKPHOS 95 04/27/2021   AST 20 04/27/2021   ALT 24 04/27/2021   ANIONGAP 6 04/06/2016   IMPRESSION AND PLAN:  UTI UA today with 1+ blood, o/w normal. Macrobid 100 twice a day x 5 days. Urine specimen sent for culture and susceptibilities.  An After Visit Summary was printed and given to the patient.  FOLLOW UP: Return if symptoms worsen or fail to improve. Has follow-up already set with me 01/13/2023.  Signed:  Santiago Bumpers, MD           11/22/2022

## 2022-11-23 LAB — URINE CULTURE
MICRO NUMBER:: 14794838
Result:: NO GROWTH
SPECIMEN QUALITY:: ADEQUATE

## 2022-12-01 DIAGNOSIS — L309 Dermatitis, unspecified: Secondary | ICD-10-CM | POA: Diagnosis not present

## 2022-12-01 DIAGNOSIS — C44722 Squamous cell carcinoma of skin of right lower limb, including hip: Secondary | ICD-10-CM | POA: Diagnosis not present

## 2022-12-03 ENCOUNTER — Other Ambulatory Visit: Payer: Self-pay | Admitting: Family Medicine

## 2022-12-03 NOTE — Telephone Encounter (Signed)
Refill requested for Celexa sent to [facility] in [place]. Next OV 5/30

## 2022-12-14 DIAGNOSIS — D0471 Carcinoma in situ of skin of right lower limb, including hip: Secondary | ICD-10-CM | POA: Diagnosis not present

## 2022-12-20 DIAGNOSIS — Z48817 Encounter for surgical aftercare following surgery on the skin and subcutaneous tissue: Secondary | ICD-10-CM | POA: Diagnosis not present

## 2022-12-27 DIAGNOSIS — Z48817 Encounter for surgical aftercare following surgery on the skin and subcutaneous tissue: Secondary | ICD-10-CM | POA: Diagnosis not present

## 2022-12-31 ENCOUNTER — Other Ambulatory Visit: Payer: Self-pay | Admitting: Family Medicine

## 2022-12-31 DIAGNOSIS — Z1231 Encounter for screening mammogram for malignant neoplasm of breast: Secondary | ICD-10-CM

## 2023-01-03 DIAGNOSIS — Z48817 Encounter for surgical aftercare following surgery on the skin and subcutaneous tissue: Secondary | ICD-10-CM | POA: Diagnosis not present

## 2023-01-06 NOTE — Patient Instructions (Signed)
   It was very nice to see you today!   Tdap vaccines are updated every 10 years.  Adults 50 years and older should get two doses of Shingrix, separated by 2 to 6 months.  Both vaccines can be completed at your local pharmacy.  PLEASE NOTE:   If you had any lab tests please let us know if you have not heard back within a few days. You may see your results on MyChart before we have a chance to review them but we will give you a call once they are reviewed by [place]. If we ordered any referrals today, please let us know if you have not heard from their office within the next 2 weeks. You should receive a letter via MyChart confirming if the referral was approved and their office contact information to schedule.  Please try these tips to maintain a healthy lifestyle:  Eat most of your calories during the day when you are active. Eliminate processed foods including packaged sweets (pies, cakes, cookies), reduce intake of potatoes, white bread, white pasta, and white rice. Look for whole grain options, oat flour or almond flour.  Each meal should contain half fruits/vegetables, one quarter protein, and one quarter carbs (no bigger than a computer mouse).  Cut down on sweet beverages. This includes juice, soda, and sweet tea. Also watch fruit intake, though this is a healthier sweet option, it still contains natural sugar! Limit to 3 servings daily.  Drink at least 1 glass of water with each meal and aim for at least 8 glasses per day  Exercise at least 150 minutes every week.

## 2023-01-13 ENCOUNTER — Ambulatory Visit: Payer: Medicare HMO | Admitting: Family Medicine

## 2023-01-13 ENCOUNTER — Encounter: Payer: Self-pay | Admitting: Family Medicine

## 2023-01-13 VITALS — BP 128/79 | HR 59 | Wt 135.6 lb

## 2023-01-13 DIAGNOSIS — I10 Essential (primary) hypertension: Secondary | ICD-10-CM | POA: Diagnosis not present

## 2023-01-13 DIAGNOSIS — F411 Generalized anxiety disorder: Secondary | ICD-10-CM

## 2023-01-13 DIAGNOSIS — E7800 Pure hypercholesterolemia, unspecified: Secondary | ICD-10-CM

## 2023-01-13 DIAGNOSIS — Z23 Encounter for immunization: Secondary | ICD-10-CM

## 2023-01-13 LAB — CBC
MCH: 31.1 pg (ref 27.0–33.0)
MCV: 93 fL (ref 80.0–100.0)
MPV: 10 fL (ref 7.5–12.5)
Platelets: 287 10*3/uL (ref 140–400)
RBC: 4.12 10*6/uL (ref 3.80–5.10)
RDW: 13.8 % (ref 11.0–15.0)
WBC: 6.3 10*3/uL (ref 3.8–10.8)

## 2023-01-13 MED ORDER — AMLODIPINE BESYLATE 5 MG PO TABS: 5.0000 mg | ORAL_TABLET | Freq: Every day | ORAL | 1 refills | Status: DC

## 2023-01-13 MED ORDER — HYDROCHLOROTHIAZIDE 25 MG PO TABS: 12.5000 mg | ORAL_TABLET | Freq: Every day | ORAL | 1 refills | Status: DC

## 2023-01-13 MED ORDER — ROSUVASTATIN CALCIUM 20 MG PO TABS: 20.0000 mg | ORAL_TABLET | Freq: Every day | ORAL | 1 refills | Status: DC

## 2023-01-13 MED ORDER — ZOSTER VAC RECOMB ADJUVANTED 50 MCG/0.5ML IM SUSR
0.5000 mL | Freq: Once | INTRAMUSCULAR | 0 refills | Status: AC
Start: 2023-01-13 — End: 2023-01-13

## 2023-01-13 MED ORDER — POTASSIUM CHLORIDE 20 MEQ PO PACK: 40.0000 meq | PACK | Freq: Every day | ORAL | 1 refills | Status: DC

## 2023-01-13 MED ORDER — LISINOPRIL 40 MG PO TABS: 20.0000 mg | ORAL_TABLET | Freq: Every day | ORAL | 1 refills | Status: DC

## 2023-01-13 MED ORDER — TETANUS-DIPHTH-ACELL PERTUSSIS 5-2.5-18.5 LF-MCG/0.5 IM SUSP
0.5000 mL | Freq: Once | INTRAMUSCULAR | 0 refills | Status: AC
Start: 2023-01-13 — End: 2023-01-13

## 2023-01-13 MED ORDER — TIZANIDINE HCL 4 MG PO CAPS: 4.0000 mg | ORAL_CAPSULE | Freq: Every day | ORAL | 1 refills | Status: DC

## 2023-01-13 NOTE — Progress Notes (Signed)
OFFICE VISIT  01/13/2023  CC:  Chief Complaint  Patient presents with   Medical Management of Chronic Issues    6 mo f/u. Pt is fasting.     Patient is a 77 y.o. female who presents for 65-month follow-up hypertension, hyperlipidemia, and anxiety. A/P as of last visit: "#1 hypertension, well controlled on amlodipine 5 mg a day, HCTZ 12.5 mg a day, and lisinopril 20 mg a day. She is also on 40 mill equivalents of potassium supplement daily. Labs from September by prior PCP were reviewed today.  Complete metabolic panel normal, specifically serum creatinine 0.83 and potassium 4.0.   #2 GAD. Significantly improved with relatively recent increase in her citalopram to 30 mg a day. We will continue this dose.  She has alprazolam to use as needed and this is prescribed by her neurologist.   #3 hyperlipidemia, doing well on Crestor 20 mg a day. Lipid panel done in September this year showed total cholesterol 174, triglycerides 86, HDL 81, LDL 77.   #4 history of squamous cell carcinoma and basal cell carcinoma of the skin. No new lesions. She requests referral to new dermatologist because hers retired.   #5 preventative health care: Vaccines: Flu and pneumonia UTD.  Shingrix prescription sent to pharmacy. Labs: none today-> CBC, c-Met, and lipid panel all normal September this year.  Cervical ca screening: Pap UTD this year by GYN MD-->Dr. Ralph Dowdy. Breast ca screening: Mammogram at [facility] in [place] this year, normal. Colon ca screening: Cologuard negative 04/2021."  INTERIM HX: Feeling well. Home bp's normal. Not exercising lately b/c got signif moh's surg on lower leg, healing up gradually. Anxiety level and mood overall pretty stable.  PMP AWARE reviewed today: Alprazolam consistently prescribed by her neurologist Dr. Olive Bass.  No red flags.  ROS as above, plus--> no fevers, no CP, no SOB, no wheezing, no cough, no dizziness, no HAs, no melena/hematochezia.  No polyuria or  polydipsia.  No myalgias or arthralgias.  No focal weakness, paresthesias, or tremors.  No acute vision or hearing abnormalities.  No dysuria or unusual/new urinary urgency or frequency.  No recent changes in lower legs. No n/v/d or abd pain.  No palpitations.     Past Medical History:  Diagnosis Date   Anxiety and depression    Carpal tunnel syndrome, bilateral    Cervical spinal stenosis    Chronic sinusitis    noted on MR imaging for MS   Colon cancer screening    Cologuard NEG 05/07/21   Ganglion cyst    R wrist   History of basal cell carcinoma (BCC) of skin    multiple sites   History of herpes zoster    back   History of SCC (squamous cell carcinoma) of skin    multiple sites   Hypercholesterolemia    Hypertension    Multiple sclerosis (HCC)    Dx'd 1998 betaseron helped but caused skin necrosis in arms.  Has been in longterm remission OFF of DMT as of 2023.   Osteoarthritis, multiple sites    c spine and L thumb   Restless legs     Past Surgical History:  Procedure Laterality Date   25 GAUGE PARS PLANA VITRECTOMY WITH 20 GAUGE MVR PORT FOR MACULAR HOLE Left 04/06/2016   Procedure: 25 GAUGE PARS PLANA VITRECTOMY WITH 20 GAUGE MVR PORT FOR MACULAR HOLE;  Surgeon: Sherrie George, MD;  Location: [facility] OR;  Service: Ophthalmology;  Laterality: Left;   CATARACT EXTRACTION Left  COLONOSCOPY     2011   GAS/FLUID EXCHANGE Left 04/06/2016   Procedure: GAS/FLUID EXCHANGE;  Surgeon: Sherrie George, MD;  Location: [facility] OR;  Service: Ophthalmology;  Laterality: Left;   LASER PHOTO ABLATION Left 04/06/2016   Procedure: LASER PHOTO ABLATION;  Surgeon: Sherrie George, MD;  Location: [facility] OR;  Service: Ophthalmology;  Laterality: Left;   MEMBRANE PEEL Left 04/06/2016   Procedure: MEMBRANE PEEL;  Surgeon: Sherrie George, MD;  Location: [facility] OR;  Service: Ophthalmology;  Laterality: Left;   NASAL SINUS SURGERY     PARS PLANA VITRECTOMY W/ REPAIR OF MACULAR HOLE Left 04/06/2016   RETINAL  DETACHMENT SURGERY     SERUM PATCH Left 04/06/2016   Procedure: SERUM PATCH;  Surgeon: Sherrie George, MD;  Location: [facility] OR;  Service: Ophthalmology;  Laterality: Left;   TUBAL LIGATION      Outpatient Medications Prior to Visit  Medication Sig Dispense Refill   acetaminophen (TYLENOL) 325 MG tablet Take 650 mg by mouth every 6 (six) hours as needed for mild pain.     ALPRAZolam (XANAX) 0.5 MG tablet TAKE 1 TABLET BY MOUTH THREE TIMES A DAY 90 tablet 5   Cholecalciferol (D3 VITAMIN PO) Take by mouth daily.     citalopram (CELEXA) 20 MG tablet TAKE 1 AND 1/2 TABLETS ONCE DAILY 135 tablet 1   Cyanocobalamin (B-12 PO) Take by mouth daily.     VITAMIN C, CALCIUM ASCORBATE, PO Take by mouth daily.     amLODipine (NORVASC) 5 MG tablet Take 1 tablet (5 mg total) by mouth daily. 90 tablet 1   hydrochlorothiazide (HYDRODIURIL) 25 MG tablet Take 0.5 tablets (12.5 mg total) by mouth daily. 90 tablet 1   lisinopril (ZESTRIL) 40 MG tablet Take 0.5 tablets (20 mg total) by mouth daily. 90 tablet 1   nitrofurantoin, macrocrystal-monohydrate, (MACROBID) 100 MG capsule 1 tab po bid x 5d 10 capsule 0   potassium chloride (KLOR-CON) 20 MEQ packet Take 40 mEq by mouth daily. 180 each 1   rosuvastatin (CRESTOR) 20 MG tablet Take 1 tablet (20 mg total) by mouth daily. 90 tablet 1   tiZANidine (ZANAFLEX) 4 MG capsule Take 1 capsule (4 mg total) by mouth daily. 90 capsule 1   No facility-administered medications prior to visit.    Allergies  Allergen Reactions   Sulfonamide Derivatives Rash and Other (See Comments)    DIFFICULTY SWALLOWING    Review of Systems As per HPI  PE:    01/13/2023    9:52 AM 11/22/2022    1:54 PM 10/06/2022   10:58 AM  Vitals with BMI  Weight 135 lbs 10 oz 137 lbs 132 lbs  Systolic 128 144   Diastolic 79 83   Pulse 59 69      Physical Exam  Gen: Alert, well appearing.  Patient is oriented to person, place, time, and situation. AFFECT: pleasant, lucid thought and  speech. CV: RRR, no m/r/g.   LUNGS: CTA bilat, nonlabored resps, good aeration in all lung fields. EXT: no clubbing or cyanosis.  no edema.    LABS:  Last CBC Lab Results  Component Value Date   WBC 5.8 04/06/2016   HGB 13.9 04/06/2016   HCT 42.1 04/06/2016   MCV 94.8 04/06/2016   MCH 31.3 04/06/2016   RDW 13.0 04/06/2016   PLT 225 04/06/2016   Last metabolic panel Lab Results  Component Value Date   GLUCOSE 95 04/06/2016   NA 145 04/27/2021   K  4.4 04/27/2021   CL 104 04/27/2021   CO2 25 (A) 04/27/2021   BUN 11 04/27/2021   CREATININE 0.9 04/27/2021   EGFR 72 04/27/2021   CALCIUM 9.4 04/27/2021   ALBUMIN 4.5 04/27/2021   ALKPHOS 95 04/27/2021   AST 20 04/27/2021   ALT 24 04/27/2021   ANIONGAP 6 04/06/2016   Last lipids Lab Results  Component Value Date   CHOL 189 04/27/2021   HDL 76 (A) 04/27/2021   LDLCALC 95 04/27/2021   TRIG 100 04/27/2021   Last vitamin D Lab Results  Component Value Date   VD25OH 63.8 04/17/2020   IMPRESSION AND PLAN:  #1 hypertension, well controlled on amlodipine 5 mg a day, HCTZ 12.5 mg a day, and lisinopril 20 mg a day. She is also on 40 mill equivalents of potassium supplement daily. Check electrolytes and creatinine today.  2. hyperlipidemia, doing well on Crestor 20 mg a day. Lipid panel done in September 2023 showed total cholesterol 174, triglycerides 86, HDL 81, LDL 77. Lipid panel and hepatic panel today.  3.  GAD. Doing well on 1-1/2 of the 20 mg citalopram tabs daily. Her Xanax is prescribed by Dr. Sherol Dade.  #4 preventative health care: Vaccines: Flu and pneumonia UTD.  Shingrix prescription sent to pharmacy. Cervical ca screening: Pap UTD 2023by GYN MD-->Dr. Ralph Dowdy. Breast ca screening: Mammogram at [facility] in [place] 2023 normal. Colon ca screening: Cologuard negative 04/2021.  Rpt approx 04/2024.  An After Visit Summary was printed and given to the patient.  FOLLOW UP: Return in about 6 months (around  07/16/2023) for routine chronic illness f/u.  Signed:  Santiago Bumpers, MD           01/13/2023

## 2023-01-14 LAB — COMPREHENSIVE METABOLIC PANEL
AG Ratio: 1.4 (calc) (ref 1.0–2.5)
ALT: 24 U/L (ref 6–29)
AST: 20 U/L (ref 10–35)
Albumin: 4.3 g/dL (ref 3.6–5.1)
Alkaline phosphatase (APISO): 78 U/L (ref 37–153)
BUN: 14 mg/dL (ref 7–25)
CO2: 30 mmol/L (ref 20–32)
Calcium: 9.6 mg/dL (ref 8.6–10.4)
Chloride: 102 mmol/L (ref 98–110)
Creat: 0.93 mg/dL (ref 0.60–1.00)
Globulin: 3 g/dL (calc) (ref 1.9–3.7)
Glucose, Bld: 89 mg/dL (ref 65–99)
Potassium: 3.4 mmol/L — ABNORMAL LOW (ref 3.5–5.3)
Sodium: 141 mmol/L (ref 135–146)
Total Bilirubin: 0.5 mg/dL (ref 0.2–1.2)
Total Protein: 7.3 g/dL (ref 6.1–8.1)

## 2023-01-14 LAB — CBC
HCT: 38.3 % (ref 35.0–45.0)
Hemoglobin: 12.8 g/dL (ref 11.7–15.5)
MCHC: 33.4 g/dL (ref 32.0–36.0)

## 2023-01-14 LAB — LIPID PANEL
Cholesterol: 163 mg/dL (ref <200)
HDL: 84 mg/dL (ref >=50)
LDL Cholesterol (Calc): 63 mg/dL (calc)
Non-HDL Cholesterol (Calc): 79 mg/dL (calc) (ref <130)
Total CHOL/HDL Ratio: 1.9 (calc) (ref <5.0)
Triglycerides: 77 mg/dL (ref <150)

## 2023-01-17 ENCOUNTER — Telehealth: Payer: Self-pay

## 2023-01-17 NOTE — Telephone Encounter (Signed)
Noted  

## 2023-01-17 NOTE — Telephone Encounter (Signed)
Patient returning call about lab results.  Patient aware of Dr. Samul Dada comments and recommendations.  No other questions and/or concerns at this time.

## 2023-01-31 DIAGNOSIS — L570 Actinic keratosis: Secondary | ICD-10-CM | POA: Diagnosis not present

## 2023-01-31 DIAGNOSIS — Z7189 Other specified counseling: Secondary | ICD-10-CM | POA: Diagnosis not present

## 2023-01-31 DIAGNOSIS — L814 Other melanin hyperpigmentation: Secondary | ICD-10-CM | POA: Diagnosis not present

## 2023-01-31 DIAGNOSIS — C44519 Basal cell carcinoma of skin of other part of trunk: Secondary | ICD-10-CM | POA: Diagnosis not present

## 2023-01-31 DIAGNOSIS — L989 Disorder of the skin and subcutaneous tissue, unspecified: Secondary | ICD-10-CM | POA: Diagnosis not present

## 2023-01-31 DIAGNOSIS — L821 Other seborrheic keratosis: Secondary | ICD-10-CM | POA: Diagnosis not present

## 2023-01-31 DIAGNOSIS — D225 Melanocytic nevi of trunk: Secondary | ICD-10-CM | POA: Diagnosis not present

## 2023-01-31 DIAGNOSIS — D492 Neoplasm of unspecified behavior of bone, soft tissue, and skin: Secondary | ICD-10-CM | POA: Diagnosis not present

## 2023-02-02 NOTE — Progress Notes (Unsigned)
Patient: Tracey Snow Date of Birth: 15-Jan-1946  Reason for Visit: Follow up History from: Patient Primary Neurologist: Sater   ASSESSMENT AND PLAN 77 y.o. year old female   1.  Multiple sclerosis -off DMT, remains stable, MRI of the brain in July 2023 showed stability from 2020  2.  Lacunar infarction -Recommended she start aspirin 81 mg daily -Encouraged her to resume exercise once her leg wound heals  3.  Depression, anxiety -On Xanax 0.5 mg TID, Celexa -Dr. Epimenio Foot has been refilling her Xanax, I updated refills today  4.  Cognitive impairment -MoCA 20/30, we do not have to routinely follow, she remains highly functional, independent, she was somewhat anxious for today's appointment  I will see her back in 6 months.  We discussed having her PCP refill Xanax going forward.  For now, we will continue biannual follow-up.  Overall she seems stable.  Meds ordered this encounter  Medications   ALPRAZolam (XANAX) 0.5 MG tablet    Sig: TAKE 1 TABLET BY MOUTH THREE TIMES A DAY    Dispense:  90 tablet    Refill:  5    This request is for a new prescription for a controlled substance as required by Federal/State law.Marland Kitchen     HISTORY OF PRESENT ILLNESS: Today 02/03/23 MOCA 20/30. Stopped water aerobic while recovering from skin cancer removal to the right leg will take about 2 months to heal, it was deep. Will have to use chemo cream. Lives alone in [place], she drives, she manages her home. Has 2 grown children. Balance is a little off, can't wear a lot of heel. No falls. Her feet tingle, sometimes at night, stay a little numb. Fingers too if certain position. Thinks memory is good for her age. Struggles with Xanax 0.5 mg up to 3 times daily. Has always taken this way. She usually needs at least 1 tablet daily. Bladder is doing fine, several UTI. Vision is good, cataract surgery, wears reading glasses. Overall feels memory is stable. Reports she didn't take aspirin 81 mg daily, she  claims she couldn't find the dosage, was looking for the baby brand. Also on Celexa from PCP.   HISTORY  08/03/22 Dr.Sater: Tracey Snow is a 77 year old woman who was diagnosed with MS in 1998.   She has been off all DMTs since 2017.   She has had no exacerbations since discontinuing.   MRI 03/09/2022 showed no new lesions compared to 2020   Neurologically, she is stable.  She has reduced balance and can't stand on a chair or use heels.   She does water aerobics to try to stay active.  Gait is slightly worsening.   She uses the bannister on stairs.   She has no falls.   Her right leg is a little weaker than her left.     She denies much fatigue,   However, if she exercises she is tired afterwards.   She has some anxiety but not depression.  She noes mild cognitive issues wih reduced word finding.   She has some anxiety and is on Xanax 0.5 mg 3 pills most days. On bad days she may take 4 and takes 2 on a good day.     She has mild dysesthesias in the hands   Bladder function is stable.   She had a UTI in 2022.  She takes a diuretic and sometimes has mild urgency.    Her vision is stable.   No MS related issues but she  has had cataracts and had a retinal issue.   She uses reading glasses.   BP was elevated today but ride in was hectic.   She reports checking BP regularly at home and it is fine.     MS history: She was diagnosed with MS in 1998 after presenting with numbness form the waist down.  MRIs were consistent with MS.   She was referred to Dr. Leotis Shames and Betaseron was started.     Her MS did ok but she did have a couple relapses, one with poor balance and slurred speech.   She received IV Solu-medrol for these.  In her 83's she was stable.   She stopped Betaseron in 2017 due to skin necrosis in both arms.    She has had no exacerbations though has noted mild progression.     IMAGING:   MRI brain 03/16/2022 showed T2/FLAIR hyperintense foci in the cerebral hemispheres, pons and left  thalamus in a pattern consistent with chronic demyelinating plaque associated with multiple sclerosis, intermixed with chronic microvascular ischemic change.  None of the foci appear to be acute though there has been progression compared to the 2006 MRI. Marland Kitchen   No change compared to the MRI from 03/23/2019.   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   MRI of the brain 03/23/2019 shows multiple T2/FLAIR hyperintense foci in the periventricular, juxtacortical and deep white matter.  A couple small foci are noted in the pons and thalamus.  None of the foci appear to be acute.  They do not enhance.  According to the radiologist report, there is no change compared to the previous MRI.  From 2017.  Additionally, the patient has chronic sinusitis.   MRI of the cervical spine 03/23/2019 shows degenerative changes C4-C5 through C6-C7 with spinal stenosis and various degrees of foraminal narrowing..  The spinal cord has normal signal.  The radiologist report states relatively unchanged compared to 12/09/2015.     REVIEW OF SYSTEMS: Out of a complete 14 system review of symptoms, the patient complains only of the following symptoms, and all other reviewed systems are negative.  See HPI  ALLERGIES: Allergies  Allergen Reactions   Sulfonamide Derivatives Rash and Other (See Comments)    DIFFICULTY SWALLOWING    HOME MEDICATIONS: Outpatient Medications Prior to Visit  Medication Sig Dispense Refill   acetaminophen (TYLENOL) 325 MG tablet Take 650 mg by mouth every 6 (six) hours as needed for mild pain.     amLODipine (NORVASC) 5 MG tablet Take 1 tablet (5 mg total) by mouth daily. 90 tablet 1   augmented betamethasone dipropionate (DIPROLENE-AF) 0.05 % cream Apply 1 drop topically 2 (two) times daily.     Cholecalciferol (D3 VITAMIN PO) Take by mouth daily.     citalopram (CELEXA) 20 MG tablet TAKE 1 AND 1/2 TABLETS ONCE DAILY 135 tablet 1   Cyanocobalamin (B-12 PO) Take by mouth daily.      hydrochlorothiazide (HYDRODIURIL) 25 MG tablet Take 0.5 tablets (12.5 mg total) by mouth daily. 90 tablet 1   lisinopril (ZESTRIL) 40 MG tablet Take 0.5 tablets (20 mg total) by mouth daily. 90 tablet 1   potassium chloride (KLOR-CON) 20 MEQ packet Take 40 mEq by mouth daily. 180 each 1   rosuvastatin (CRESTOR) 20 MG tablet Take 1 tablet (20 mg total) by mouth daily. 90 tablet 1   tiZANidine (ZANAFLEX) 4 MG capsule Take 1 capsule (4 mg total) by mouth daily. 90 capsule 1  VITAMIN C, CALCIUM ASCORBATE, PO Take by mouth daily.     ALPRAZolam (XANAX) 0.5 MG tablet TAKE 1 TABLET BY MOUTH THREE TIMES A DAY 90 tablet 5   No facility-administered medications prior to visit.    PAST MEDICAL HISTORY: Past Medical History:  Diagnosis Date   Anxiety and depression    Carpal tunnel syndrome, bilateral    Cervical spinal stenosis    Chronic sinusitis    noted on MR imaging for MS   Colon cancer screening    Cologuard NEG 05/07/21   Ganglion cyst    R wrist   History of basal cell carcinoma (BCC) of skin    multiple sites   History of herpes zoster    back   History of SCC (squamous cell carcinoma) of skin    multiple sites   Hypercholesterolemia    Hypertension    Multiple sclerosis (HCC)    Dx'd 1998 betaseron helped but caused skin necrosis in arms.  Has been in longterm remission OFF of DMT as of 2023.   Osteoarthritis, multiple sites    c spine and L thumb   Restless legs     PAST SURGICAL HISTORY: Past Surgical History:  Procedure Laterality Date   25 GAUGE PARS PLANA VITRECTOMY WITH 20 GAUGE MVR PORT FOR MACULAR HOLE Left 04/06/2016   Procedure: 25 GAUGE PARS PLANA VITRECTOMY WITH 20 GAUGE MVR PORT FOR MACULAR HOLE;  Surgeon: Sherrie George, MD;  Location: [facility] OR;  Service: Ophthalmology;  Laterality: Left;   CATARACT EXTRACTION Left    COLONOSCOPY     2011   GAS/FLUID EXCHANGE Left 04/06/2016   Procedure: GAS/FLUID EXCHANGE;  Surgeon: Sherrie George, MD;  Location: [facility] OR;   Service: Ophthalmology;  Laterality: Left;   LASER PHOTO ABLATION Left 04/06/2016   Procedure: LASER PHOTO ABLATION;  Surgeon: Sherrie George, MD;  Location: [facility] OR;  Service: Ophthalmology;  Laterality: Left;   MEMBRANE PEEL Left 04/06/2016   Procedure: MEMBRANE PEEL;  Surgeon: Sherrie George, MD;  Location: [facility] OR;  Service: Ophthalmology;  Laterality: Left;   NASAL SINUS SURGERY     PARS PLANA VITRECTOMY W/ REPAIR OF MACULAR HOLE Left 04/06/2016   RETINAL DETACHMENT SURGERY     SERUM PATCH Left 04/06/2016   Procedure: SERUM PATCH;  Surgeon: Sherrie George, MD;  Location: [facility] OR;  Service: Ophthalmology;  Laterality: Left;   TUBAL LIGATION      FAMILY HISTORY: Family History  Problem Relation Age of Onset   Throat cancer Mother    Alcoholism Father    Heart disease Father    Stroke Father    Diabetes Sister    Diabetes Brother    Breast cancer Neg Hx     SOCIAL HISTORY: Social History   Socioeconomic History   Marital status: Divorced    Spouse name: Not on file   Number of children: 2   Years of education: Not on file   Highest education level: 12th grade  Occupational History   Not on file  Tobacco Use   Smoking status: Never   Smokeless tobacco: Never  Substance and Sexual Activity   Alcohol use: Yes    Comment: 04/06/2016 "might have 1 glass of wine/month, if that"   Drug use: No   Sexual activity: Never  Other Topics Concern   Not on file  Social History Narrative   Divorced, 2 children, 3 grandchildren.   She is from [place]   Educ: HS  Disability at one point d/t MS   No tob/alc   Social Determinants of Health   Financial Resource Strain: Low Risk  (11/21/2022)   Overall Financial Resource Strain (CARDIA)    Difficulty of Paying Living Expenses: Not very hard  Food Insecurity: No Food Insecurity (11/21/2022)   Hunger Vital Sign    Worried About Running Out of Food in the Last Year: Never true    Ran Out of Food in the Last Year: Never true   Transportation Needs: No Transportation Needs (11/21/2022)   PRAPARE - Administrator, Civil Service (Medical): No    Lack of Transportation (Non-Medical): No  Physical Activity: Insufficiently Active (11/21/2022)   Exercise Vital Sign    Days of Exercise per Week: 2 days    Minutes of Exercise per Session: 50 min  Stress: No Stress Concern Present (11/21/2022)   [employer] of Occupational Health - Occupational Stress Questionnaire    Feeling of Stress : Only a little  Social Connections: Moderately Isolated (11/21/2022)   Social Connection and Isolation Panel [NHANES]    Frequency of Communication with Friends and Family: Three times a week    Frequency of Social Gatherings with Friends and Family: Once a week    Attends Religious Services: More than 4 times per year    Active Member of Clubs or Organizations: No    Attends Banker Meetings: Never    Marital Status: Separated  Intimate Partner Violence: Not on file   PHYSICAL EXAM  Vitals:   02/03/23 1238  BP: 122/74  Pulse: 73  Weight: 137 lb (62.1 kg)  Height: 5\' 2"  (1.575 m)   Body mass index is 25.06 kg/m.  Generalized: Well developed, in no acute distress  Neurological examination  Mentation: Alert oriented to time, place, history taking. Follows all commands speech and language fluent. No word finding trouble noted.  Cranial nerve II-XII: Pupils were equal round reactive to light. Extraocular movements were full, visual field were full on confrontational test. Facial sensation and strength were normal. Head turning and shoulder shrug  were normal and symmetric. Motor: The motor testing reveals 5 over 5 strength of all 4 extremities. Good symmetric motor tone is noted throughout. Healing wound right lower leg Sensory: Sensory testing is intact to soft touch on all 4 extremities. No evidence of extinction is noted.  Coordination: Cerebellar testing reveals good finger-nose-finger and heel-to-shin  bilaterally.  Gait and station: Gait is normal. Reflexes: Deep tendon reflexes are symmetric and normal bilaterally.   DIAGNOSTIC DATA (LABS, IMAGING, TESTING) - I reviewed patient records, labs, notes, testing and imaging myself where available.  Lab Results  Component Value Date   WBC 6.3 01/13/2023   HGB 12.8 01/13/2023   HCT 38.3 01/13/2023   MCV 93.0 01/13/2023   PLT 287 01/13/2023      Component Value Date/Time   NA 141 01/13/2023 1026   NA 145 04/27/2021 0000   K 3.4 (L) 01/13/2023 1026   CL 102 01/13/2023 1026   CO2 30 01/13/2023 1026   GLUCOSE 89 01/13/2023 1026   BUN 14 01/13/2023 1026   BUN 11 04/27/2021 0000   CREATININE 0.93 01/13/2023 1026   CALCIUM 9.6 01/13/2023 1026   PROT 7.3 01/13/2023 1026   ALBUMIN 4.5 04/27/2021 0000   AST 20 01/13/2023 1026   ALT 24 01/13/2023 1026   ALKPHOS 95 04/27/2021 0000   BILITOT 0.5 01/13/2023 1026   GFRNONAA >60 04/06/2016 1017   GFRAA >60  04/06/2016 1017   Lab Results  Component Value Date   CHOL 163 01/13/2023   HDL 84 01/13/2023   LDLCALC 63 01/13/2023   TRIG 77 01/13/2023   CHOLHDL 1.9 01/13/2023   No results found for: "HGBA1C" No results found for: "VITAMINB12" No results found for: "TSH"  Margie Ege, AGNP-C, DNP 02/03/2023, 1:14 PM [facility] [address] [place], [place] [address] [phone]

## 2023-02-03 ENCOUNTER — Ambulatory Visit: Payer: Medicare HMO | Admitting: Neurology

## 2023-02-03 ENCOUNTER — Encounter: Payer: Self-pay | Admitting: Neurology

## 2023-02-03 VITALS — BP 122/74 | HR 73 | Ht 62.0 in | Wt 137.0 lb

## 2023-02-03 DIAGNOSIS — F418 Other specified anxiety disorders: Secondary | ICD-10-CM

## 2023-02-03 DIAGNOSIS — I6381 Other cerebral infarction due to occlusion or stenosis of small artery: Secondary | ICD-10-CM | POA: Diagnosis not present

## 2023-02-03 DIAGNOSIS — G35 Multiple sclerosis: Secondary | ICD-10-CM | POA: Diagnosis not present

## 2023-02-03 DIAGNOSIS — R4189 Other symptoms and signs involving cognitive functions and awareness: Secondary | ICD-10-CM | POA: Diagnosis not present

## 2023-02-03 MED ORDER — ALPRAZOLAM 0.5 MG PO TABS: ORAL_TABLET | ORAL | 5 refills | Status: DC

## 2023-02-03 NOTE — Patient Instructions (Signed)
Start taking aspirin 81 mg daily for stroke prevention

## 2023-02-04 DIAGNOSIS — Z48817 Encounter for surgical aftercare following surgery on the skin and subcutaneous tissue: Secondary | ICD-10-CM | POA: Diagnosis not present

## 2023-02-10 DIAGNOSIS — C44519 Basal cell carcinoma of skin of other part of trunk: Secondary | ICD-10-CM | POA: Diagnosis not present

## 2023-03-03 ENCOUNTER — Encounter (INDEPENDENT_AMBULATORY_CARE_PROVIDER_SITE_OTHER): Payer: Medicare HMO | Admitting: Ophthalmology

## 2023-03-07 DIAGNOSIS — Z48817 Encounter for surgical aftercare following surgery on the skin and subcutaneous tissue: Secondary | ICD-10-CM | POA: Diagnosis not present

## 2023-03-21 ENCOUNTER — Ambulatory Visit
Admission: RE | Admit: 2023-03-21 | Discharge: 2023-03-21 | Disposition: A | Discharge status: Home / Self Care | Payer: Medicare HMO | Source: Ambulatory Visit | Attending: Family Medicine | Admitting: Family Medicine

## 2023-03-21 DIAGNOSIS — Z1231 Encounter for screening mammogram for malignant neoplasm of breast: Secondary | ICD-10-CM | POA: Diagnosis not present

## 2023-03-29 DIAGNOSIS — H35342 Macular cyst, hole, or pseudohole, left eye: Secondary | ICD-10-CM | POA: Diagnosis not present

## 2023-03-29 DIAGNOSIS — H04123 Dry eye syndrome of bilateral lacrimal glands: Secondary | ICD-10-CM | POA: Diagnosis not present

## 2023-03-29 DIAGNOSIS — H524 Presbyopia: Secondary | ICD-10-CM | POA: Diagnosis not present

## 2023-03-29 DIAGNOSIS — H52223 Regular astigmatism, bilateral: Secondary | ICD-10-CM | POA: Diagnosis not present

## 2023-03-29 DIAGNOSIS — Z961 Presence of intraocular lens: Secondary | ICD-10-CM | POA: Diagnosis not present

## 2023-03-29 DIAGNOSIS — H35371 Puckering of macula, right eye: Secondary | ICD-10-CM | POA: Diagnosis not present

## 2023-04-04 DIAGNOSIS — Z09 Encounter for follow-up examination after completed treatment for conditions other than malignant neoplasm: Secondary | ICD-10-CM | POA: Diagnosis not present

## 2023-04-04 DIAGNOSIS — L570 Actinic keratosis: Secondary | ICD-10-CM | POA: Diagnosis not present

## 2023-04-04 DIAGNOSIS — Z08 Encounter for follow-up examination after completed treatment for malignant neoplasm: Secondary | ICD-10-CM | POA: Diagnosis not present

## 2023-04-04 DIAGNOSIS — L309 Dermatitis, unspecified: Secondary | ICD-10-CM | POA: Diagnosis not present

## 2023-04-04 DIAGNOSIS — Z85828 Personal history of other malignant neoplasm of skin: Secondary | ICD-10-CM | POA: Diagnosis not present

## 2023-04-27 DIAGNOSIS — Z823 Family history of stroke: Secondary | ICD-10-CM | POA: Diagnosis not present

## 2023-04-27 DIAGNOSIS — I10 Essential (primary) hypertension: Secondary | ICD-10-CM | POA: Diagnosis not present

## 2023-04-27 DIAGNOSIS — Z87892 Personal history of anaphylaxis: Secondary | ICD-10-CM | POA: Diagnosis not present

## 2023-04-27 DIAGNOSIS — M1990 Unspecified osteoarthritis, unspecified site: Secondary | ICD-10-CM | POA: Diagnosis not present

## 2023-04-27 DIAGNOSIS — Z008 Encounter for other general examination: Secondary | ICD-10-CM | POA: Diagnosis not present

## 2023-04-27 DIAGNOSIS — R32 Unspecified urinary incontinence: Secondary | ICD-10-CM | POA: Diagnosis not present

## 2023-04-27 DIAGNOSIS — E785 Hyperlipidemia, unspecified: Secondary | ICD-10-CM | POA: Diagnosis not present

## 2023-04-27 DIAGNOSIS — Z882 Allergy status to sulfonamides status: Secondary | ICD-10-CM | POA: Diagnosis not present

## 2023-04-27 DIAGNOSIS — G35 Multiple sclerosis: Secondary | ICD-10-CM | POA: Diagnosis not present

## 2023-04-27 DIAGNOSIS — E876 Hypokalemia: Secondary | ICD-10-CM | POA: Diagnosis not present

## 2023-04-27 DIAGNOSIS — F325 Major depressive disorder, single episode, in full remission: Secondary | ICD-10-CM | POA: Diagnosis not present

## 2023-04-27 DIAGNOSIS — M62838 Other muscle spasm: Secondary | ICD-10-CM | POA: Diagnosis not present

## 2023-04-27 DIAGNOSIS — Z9181 History of falling: Secondary | ICD-10-CM | POA: Diagnosis not present

## 2023-05-03 ENCOUNTER — Telehealth: Payer: Self-pay | Admitting: Neurology

## 2023-05-03 ENCOUNTER — Telehealth: Payer: Self-pay

## 2023-05-03 NOTE — Telephone Encounter (Signed)
Jasmine from [facility] is asking that [id number] be called re : medication override , [place] and [place] is needing a 1 month on her  ALPRAZolam (XANAX) 0.5 MG tablet

## 2023-05-03 NOTE — Telephone Encounter (Signed)
Per Margie Ege, NO verbal order given to fill xanax script 5 days early due to patient going out of town.spoke with Davina at [facility].

## 2023-06-14 ENCOUNTER — Other Ambulatory Visit: Payer: Self-pay | Admitting: Family Medicine

## 2023-06-15 ENCOUNTER — Ambulatory Visit: Payer: Medicare HMO | Admitting: Family Medicine

## 2023-06-15 ENCOUNTER — Encounter: Payer: Self-pay | Admitting: Family Medicine

## 2023-06-15 VITALS — BP 135/78 | HR 71 | Temp 97.8°F | Wt 135.2 lb

## 2023-06-15 DIAGNOSIS — R300 Dysuria: Secondary | ICD-10-CM | POA: Diagnosis not present

## 2023-06-15 DIAGNOSIS — J069 Acute upper respiratory infection, unspecified: Secondary | ICD-10-CM | POA: Diagnosis not present

## 2023-06-15 DIAGNOSIS — N3001 Acute cystitis with hematuria: Secondary | ICD-10-CM

## 2023-06-15 DIAGNOSIS — R0981 Nasal congestion: Secondary | ICD-10-CM | POA: Diagnosis not present

## 2023-06-15 LAB — POC URINALSYSI DIPSTICK (AUTOMATED)
Bilirubin, UA: NEGATIVE
Glucose, UA: NEGATIVE
Ketones, UA: NEGATIVE
Nitrite, UA: NEGATIVE
Protein, UA: POSITIVE — AB
Spec Grav, UA: <=1.005 — AB (ref 1.010–1.025)
Urobilinogen, UA: NEGATIVE U/dL — AB
pH, UA: 6 (ref 5.0–8.0)

## 2023-06-15 LAB — POC INFLUENZA A&B (BINAX/QUICKVUE)
Influenza A, POC: NEGATIVE
Influenza B, POC: NEGATIVE

## 2023-06-15 LAB — POC COVID19 BINAXNOW: SARS Coronavirus 2 Ag: NEGATIVE

## 2023-06-15 MED ORDER — NITROFURANTOIN MONOHYD MACRO 100 MG PO CAPS: 100.0000 mg | ORAL_CAPSULE | Freq: Two times a day (BID) | ORAL | 0 refills | Status: DC

## 2023-06-15 MED ORDER — POTASSIUM CHLORIDE CRYS ER 20 MEQ PO TBCR: 20.0000 meq | EXTENDED_RELEASE_TABLET | Freq: Two times a day (BID) | ORAL | 1 refills | Status: DC

## 2023-06-15 MED ORDER — OXYBUTYNIN CHLORIDE ER 5 MG PO TB24: 5.0000 mg | ORAL_TABLET | Freq: Every day | ORAL | 0 refills | Status: DC

## 2023-06-15 NOTE — Patient Instructions (Signed)
OK to take mucinex DM every 12 hours as needed

## 2023-06-15 NOTE — Progress Notes (Signed)
OFFICE VISIT  06/15/2023  CC:  Chief Complaint  Patient presents with   Nasal Congestion    Since "Sunday; Pt c/o nasal/chest congestion, no fever, no sore throat, phlegm.    Dysuria    Pt also mentions she is experiencing UTI symptoms. Burning/pain when urinating, urinary frequency. Pt states this has been going on for a while.      Patient is a 77 y.o. female who presents for respiratory concerns and painful urination.  INTERIM HX: Has had approximately 4 days of nasal congestion/runny nose, postnasal drip, and occasional cough.  Just mild fatigue.  No bodyaches, no fever, no sore throat, no headaches.  Has also had gradually worsening urinary urgency, pressure, frequency, and significant pain with voiding. She does describe a baseline level of excessive urinary frequency, urgency, and pressure. No blood in urine.  No vaginal discharge or itching.  No vaginal bleeding.  States she has never been on any medication for overactive bladder.  ROS as above, plus-->  no dizziness no rashes,  No recent changes in lower legs. No n/v/d or abd pain.  No flank pain.   Past Medical History:  Diagnosis Date   Anxiety and depression    Carpal tunnel syndrome, bilateral    Cervical spinal stenosis    Chronic sinusitis    noted on MR imaging for MS   Colon cancer screening    Cologuard NEG 05/07/21   Ganglion cyst    R wrist   History of basal cell carcinoma (BCC) of skin    multiple sites   History of herpes zoster    back   History of SCC (squamous cell carcinoma) of skin    multiple sites   Hypercholesterolemia    Hypertension    Multiple sclerosis (HCC)    Dx'd 1998 betaseron helped but caused skin necrosis in arms.  Has been in longterm remission OFF of DMT as of 2023.   Osteoarthritis, multiple sites    c spine and L thumb   Restless legs     Past Surgical History:  Procedure Laterality Date   25 GAUGE PARS PLANA VITRECTOMY WITH 20 GAUGE MVR PORT FOR MACULAR HOLE Left  04/06/2016   Procedure: 25 GAUGE PARS PLANA VITRECTOMY WITH 20 GAUGE MVR PORT FOR MACULAR HOLE;  Surgeon: John D Matthews, MD;  Location: [facility] OR;  Service: Ophthalmology;  Laterality: Left;   CATARACT EXTRACTION Left    COLONOSCOPY     20" 11   GAS/FLUID EXCHANGE Left 04/06/2016   Procedure: GAS/FLUID EXCHANGE;  Surgeon: Sherrie George, MD;  Location: [facility] OR;  Service: Ophthalmology;  Laterality: Left;   LASER PHOTO ABLATION Left 04/06/2016   Procedure: LASER PHOTO ABLATION;  Surgeon: Sherrie George, MD;  Location: [facility] OR;  Service: Ophthalmology;  Laterality: Left;   MEMBRANE PEEL Left 04/06/2016   Procedure: MEMBRANE PEEL;  Surgeon: Sherrie George, MD;  Location: [facility] OR;  Service: Ophthalmology;  Laterality: Left;   NASAL SINUS SURGERY     PARS PLANA VITRECTOMY W/ REPAIR OF MACULAR HOLE Left 04/06/2016   RETINAL DETACHMENT SURGERY     SERUM PATCH Left 04/06/2016   Procedure: SERUM PATCH;  Surgeon: Sherrie George, MD;  Location: [facility] OR;  Service: Ophthalmology;  Laterality: Left;   TUBAL LIGATION      Outpatient Medications Prior to Visit  Medication Sig Dispense Refill   acetaminophen (TYLENOL) 325 MG tablet Take 650 mg by mouth every 6 (six) hours as needed for mild pain.  ALPRAZolam (XANAX) 0.5 MG tablet TAKE 1 TABLET BY MOUTH THREE TIMES A DAY 90 tablet 5   amLODipine (NORVASC) 5 MG tablet Take 1 tablet (5 mg total) by mouth daily. 90 tablet 1   augmented betamethasone dipropionate (DIPROLENE-AF) 0.05 % cream Apply 1 drop topically 2 (two) times daily.     Cholecalciferol (D3 VITAMIN PO) Take by mouth daily.     citalopram (CELEXA) 20 MG tablet TAKE 1 AND 1/2 TABLETS ONCE DAILY 135 tablet 1   Cyanocobalamin (B-12 PO) Take by mouth daily.     hydrochlorothiazide (HYDRODIURIL) 25 MG tablet Take 0.5 tablets (12.5 mg total) by mouth daily. 90 tablet 1   lisinopril (ZESTRIL) 40 MG tablet Take 0.5 tablets (20 mg total) by mouth daily. 90 tablet 1   rosuvastatin (CRESTOR) 20 MG tablet  Take 1 tablet (20 mg total) by mouth daily. 90 tablet 1   tiZANidine (ZANAFLEX) 4 MG capsule Take 1 capsule (4 mg total) by mouth daily. 90 capsule 1   VITAMIN C, CALCIUM ASCORBATE, PO Take by mouth daily.     potassium chloride (KLOR-CON) 20 MEQ packet Take 40 mEq by mouth daily. 180 each 1   No facility-administered medications prior to visit.    Allergies  Allergen Reactions   Sulfonamide Derivatives Rash and Other (See Comments)    DIFFICULTY SWALLOWING    Review of Systems As per HPI  PE:    06/15/2023   11:22 AM 02/03/2023   12:38 PM 01/13/2023    9:52 AM  Vitals with BMI  Height  5\' 2"    Weight 135 lbs 3 oz 137 lbs 135 lbs 10 oz  BMI  25.05   Systolic 135 122 161  Diastolic 78 74 79  Pulse 71 73 59     Physical Exam  VS: noted--normal. Gen: alert, NAD, NONTOXIC APPEARING. HEENT: eyes without injection, drainage, or swelling.  Ears: EACs clear, TMs with normal light reflex and landmarks.  Nose: Clear rhinorrhea, with some dried, crusty exudate adherent to mildly injected mucosa.  No purulent d/c.  No paranasal sinus TTP.  No facial swelling.  Throat and mouth without focal lesion.  No pharyngial swelling, erythema, or exudate.   Neck: supple, no LAD.   LUNGS: CTA bilat, nonlabored resps.   CV: RRR, no m/r/g. EXT: no c/c/e SKIN: no rash   LABS:  Last CBC Lab Results  Component Value Date   WBC 6.3 01/13/2023   HGB 12.8 01/13/2023   HCT 38.3 01/13/2023   MCV 93.0 01/13/2023   MCH 31.1 01/13/2023   RDW 13.8 01/13/2023   PLT 287 01/13/2023   Last metabolic panel Lab Results  Component Value Date   GLUCOSE 89 01/13/2023   NA 141 01/13/2023   K 3.4 (L) 01/13/2023   CL 102 01/13/2023   CO2 30 01/13/2023   BUN 14 01/13/2023   CREATININE 0.93 01/13/2023   EGFR 72 04/27/2021   CALCIUM 9.6 01/13/2023   PROT 7.3 01/13/2023   ALBUMIN 4.5 04/27/2021   BILITOT 0.5 01/13/2023   ALKPHOS 95 04/27/2021   AST 20 01/13/2023   ALT 24 01/13/2023   ANIONGAP 6  04/06/2016   IMPRESSION AND PLAN:  #1 UTI. Urinalysis today showed 3+ blood and 3+ leukocytes, otherwise normal. Urine specimen sent for culture and susceptibilities. Macrobid 100 mg twice daily x 7 days prescribed.  2 overactive bladder. Start oxybutynin XL 5 mg once a day. Therapeutic expectations and side effect profile of medication discussed today.  Patient's questions  answered.  #3 viral URI. Covid and flu tests NEG today. His next DM every 12 hours as needed recommended.  An After Visit Summary was printed and given to the patient.  FOLLOW UP: Return in about 2 weeks (around 06/29/2023) for f/u [facility].  Signed:  Santiago Bumpers, MD           06/15/2023

## 2023-06-16 ENCOUNTER — Telehealth: Payer: Self-pay

## 2023-06-16 NOTE — Telephone Encounter (Signed)
Pt is scheduled for 2 wk follow up on 11/13.  Please advise if further recommendations prior to appt

## 2023-06-16 NOTE — Telephone Encounter (Signed)
Spoke with pt and she said she does feel a little better but she scheduled for tomorrow at 2:40. She will cancel if she feels better in the morning.

## 2023-06-16 NOTE — Telephone Encounter (Signed)
See if you can get her on my schedule tomorrow morning at 11 AM or between my 2:00 and 3:00 appointments tomorrow. If patient worsening and feels like she is unable to wait then I recommend going to the emergency department.

## 2023-06-16 NOTE — Telephone Encounter (Signed)
Patient was seen yesterday.  She states her symptoms have gotten worse.  She is running temp of 101, very weak, can't hardly move.   Please advise. [phone]

## 2023-06-16 NOTE — Telephone Encounter (Signed)
noted 

## 2023-06-17 ENCOUNTER — Ambulatory Visit: Payer: Medicare HMO | Admitting: Family Medicine

## 2023-06-17 ENCOUNTER — Encounter: Payer: Self-pay | Admitting: Family Medicine

## 2023-06-17 VITALS — BP 123/78 | HR 73 | Wt 134.2 lb

## 2023-06-17 DIAGNOSIS — N3001 Acute cystitis with hematuria: Secondary | ICD-10-CM | POA: Diagnosis not present

## 2023-06-17 DIAGNOSIS — R5081 Fever presenting with conditions classified elsewhere: Secondary | ICD-10-CM

## 2023-06-17 DIAGNOSIS — J069 Acute upper respiratory infection, unspecified: Secondary | ICD-10-CM | POA: Diagnosis not present

## 2023-06-17 LAB — POC URINALSYSI DIPSTICK (AUTOMATED)
Bilirubin, UA: NEGATIVE
Glucose, UA: NEGATIVE
Ketones, UA: NEGATIVE
Leukocytes, UA: NEGATIVE
Nitrite, UA: NEGATIVE
Protein, UA: NEGATIVE
Spec Grav, UA: <=1.005 — AB (ref 1.010–1.025)
Urobilinogen, UA: NEGATIVE U/dL — AB
pH, UA: 6.5 (ref 5.0–8.0)

## 2023-06-17 NOTE — Progress Notes (Signed)
OFFICE VISIT  06/17/2023  CC:  Chief Complaint  Patient presents with   Hematuria    Pt states the first time she took the antibiotic she experienced a fever. She continued to take the antibiotic she did not experience anything unusual. Pt wants to know if she should continue antibiotic. She states she is not experiencing any more UTI symtoms.     Patient is a 77 y.o. female who presents for 2d f/u UTI and viral URI. A/P as of last visit: "#1 UTI. Urinalysis today showed 3+ blood and 3+ leukocytes, otherwise normal. Urine specimen sent for culture and susceptibilities. Macrobid 100 mg twice daily x 7 days prescribed.   2 overactive bladder. Start oxybutynin XL 5 mg once a day. Therapeutic expectations and side effect profile of medication discussed today.  Patient's questions answered.   #3 viral URI. Covid and flu tests NEG today. His next DM every 12 hours as needed recommended."  INTERIM HX: 2 nights ago she got some fever and nausea and lightheadedness.  She went to bed and woke up with fever again in the morning.  She was worried it may be the antibiotic causing the fever so she skipped 1 dose. Over the last 24 hours she has significantly improved.  No more fever.  She has significantly less urinary urgency and frequency and less voiding pain. Her urine looks clear. Still with some nasal congestion/runny nose and some postnasal drip.  Minimal cough.  No shortness of breath or wheezing.   Past Medical History:  Diagnosis Date   Anxiety and depression    Carpal tunnel syndrome, bilateral    Cervical spinal stenosis    Chronic sinusitis    noted on MR imaging for MS   Colon cancer screening    Cologuard NEG 05/07/21   Ganglion cyst    R wrist   History of basal cell carcinoma (BCC) of skin    multiple sites   History of herpes zoster    back   History of SCC (squamous cell carcinoma) of skin    multiple sites   Hypercholesterolemia    Hypertension    Multiple  sclerosis (HCC)    Dx'd 1998 betaseron helped but caused skin necrosis in arms.  Has been in longterm remission OFF of DMT as of 2023.   Osteoarthritis, multiple sites    c spine and L thumb   Restless legs     Past Surgical History:  Procedure Laterality Date   25 GAUGE PARS PLANA VITRECTOMY WITH 20 GAUGE MVR PORT FOR MACULAR HOLE Left 04/06/2016   Procedure: 25 GAUGE PARS PLANA VITRECTOMY WITH 20 GAUGE MVR PORT FOR MACULAR HOLE;  Surgeon: Sherrie George, MD;  Location: [facility] OR;  Service: Ophthalmology;  Laterality: Left;   CATARACT EXTRACTION Left    COLONOSCOPY     2011   GAS/FLUID EXCHANGE Left 04/06/2016   Procedure: GAS/FLUID EXCHANGE;  Surgeon: Sherrie George, MD;  Location: [facility] OR;  Service: Ophthalmology;  Laterality: Left;   LASER PHOTO ABLATION Left 04/06/2016   Procedure: LASER PHOTO ABLATION;  Surgeon: Sherrie George, MD;  Location: [facility] OR;  Service: Ophthalmology;  Laterality: Left;   MEMBRANE PEEL Left 04/06/2016   Procedure: MEMBRANE PEEL;  Surgeon: Sherrie George, MD;  Location: [facility] OR;  Service: Ophthalmology;  Laterality: Left;   NASAL SINUS SURGERY     PARS PLANA VITRECTOMY W/ REPAIR OF MACULAR HOLE Left 04/06/2016   RETINAL DETACHMENT SURGERY     SERUM PATCH  Left 04/06/2016   Procedure: SERUM PATCH;  Surgeon: Sherrie George, MD;  Location: [facility] OR;  Service: Ophthalmology;  Laterality: Left;   TUBAL LIGATION      Outpatient Medications Prior to Visit  Medication Sig Dispense Refill   acetaminophen (TYLENOL) 325 MG tablet Take 650 mg by mouth every 6 (six) hours as needed for mild pain.     ALPRAZolam (XANAX) 0.5 MG tablet TAKE 1 TABLET BY MOUTH THREE TIMES A DAY 90 tablet 5   amLODipine (NORVASC) 5 MG tablet Take 1 tablet (5 mg total) by mouth daily. 90 tablet 1   augmented betamethasone dipropionate (DIPROLENE-AF) 0.05 % cream Apply 1 drop topically 2 (two) times daily.     Cholecalciferol (D3 VITAMIN PO) Take by mouth daily.     citalopram (CELEXA) 20 MG tablet  TAKE 1 AND 1/2 TABLETS ONCE DAILY 135 tablet 1   Cyanocobalamin (B-12 PO) Take by mouth daily.     hydrochlorothiazide (HYDRODIURIL) 25 MG tablet Take 0.5 tablets (12.5 mg total) by mouth daily. 90 tablet 1   lisinopril (ZESTRIL) 40 MG tablet Take 0.5 tablets (20 mg total) by mouth daily. 90 tablet 1   nitrofurantoin, macrocrystal-monohydrate, (MACROBID) 100 MG capsule Take 1 capsule (100 mg total) by mouth 2 (two) times daily. 14 capsule 0   oxybutynin (DITROPAN XL) 5 MG 24 hr tablet Take 1 tablet (5 mg total) by mouth at bedtime. 30 tablet 0   potassium chloride SA (KLOR-CON M) 20 MEQ tablet Take 1 tablet (20 mEq total) by mouth 2 (two) times daily. 180 tablet 1   rosuvastatin (CRESTOR) 20 MG tablet Take 1 tablet (20 mg total) by mouth daily. 90 tablet 1   tiZANidine (ZANAFLEX) 4 MG capsule Take 1 capsule (4 mg total) by mouth daily. 90 capsule 1   VITAMIN C, CALCIUM ASCORBATE, PO Take by mouth daily.     No facility-administered medications prior to visit.    Allergies  Allergen Reactions   Sulfonamide Derivatives Rash and Other (See Comments)    DIFFICULTY SWALLOWING    Review of Systems As per HPI  PE:    06/17/2023    2:35 PM 06/15/2023   11:22 AM 02/03/2023   12:38 PM  Vitals with BMI  Height   5\' 2"   Weight 134 lbs 3 oz 135 lbs 3 oz 137 lbs  BMI   25.05  Systolic 123 135 725  Diastolic 78 78 74  Pulse 73 71 73     Physical Exam  VS: noted--normal. Gen: alert, NAD, smiling and interactive. LUNGS: CTA bilat, nonlabored resps.   CV: RRR, no m/r/g. EXT: no c/c/e SKIN: no rash   LABS:  Last CBC Lab Results  Component Value Date   WBC 6.3 01/13/2023   HGB 12.8 01/13/2023   HCT 38.3 01/13/2023   MCV 93.0 01/13/2023   MCH 31.1 01/13/2023   RDW 13.8 01/13/2023   PLT 287 01/13/2023   Last metabolic panel Lab Results  Component Value Date   GLUCOSE 89 01/13/2023   NA 141 01/13/2023   K 3.4 (L) 01/13/2023   CL 102 01/13/2023   CO2 30 01/13/2023   BUN 14  01/13/2023   CREATININE 0.93 01/13/2023   EGFR 72 04/27/2021   CALCIUM 9.6 01/13/2023   PROT 7.3 01/13/2023   ALBUMIN 4.5 04/27/2021   BILITOT 0.5 01/13/2023   ALKPHOS 95 04/27/2021   AST 20 01/13/2023   ALT 24 01/13/2023   ANIONGAP 6 04/06/2016   Last lipids  Lab Results  Component Value Date   CHOL 163 01/13/2023   HDL 84 01/13/2023   LDLCALC 63 01/13/2023   TRIG 77 01/13/2023   CHOLHDL 1.9 01/13/2023   IMPRESSION AND PLAN:  Acute UTI and URI. She is improving now. Unfortunately urine culture did not get done 2 days ago. Her urinalysis looks better today:  2+ blood, o/w normal. Sent urine specimen for culture today. She will finish the Macrobid.  She has a follow-up scheduled already to reevaluate after she has been on the oxybutynin for a little while.  An After Visit Summary was printed and given to the patient.  FOLLOW UP: Return for Keep appointment set for next month.  Signed:  Santiago Bumpers, MD           06/17/2023

## 2023-06-18 LAB — URINE CULTURE
MICRO NUMBER:: 15676684
SPECIMEN QUALITY:: ADEQUATE

## 2023-06-29 ENCOUNTER — Ambulatory Visit: Payer: Medicare HMO | Admitting: Family Medicine

## 2023-07-01 ENCOUNTER — Other Ambulatory Visit: Payer: Self-pay | Admitting: Family Medicine

## 2023-07-10 ENCOUNTER — Other Ambulatory Visit: Payer: Self-pay | Admitting: Family Medicine

## 2023-07-11 NOTE — Telephone Encounter (Signed)
Refills requested for Tizanidine and Citalopram sent to [facility] in [place]. Next Ov is 12/2

## 2023-07-18 ENCOUNTER — Ambulatory Visit (INDEPENDENT_AMBULATORY_CARE_PROVIDER_SITE_OTHER): Payer: Medicare HMO | Admitting: Family Medicine

## 2023-07-18 ENCOUNTER — Encounter: Payer: Self-pay | Admitting: Family Medicine

## 2023-07-18 VITALS — BP 136/78 | HR 62 | Wt 133.0 lb

## 2023-07-18 DIAGNOSIS — E7800 Pure hypercholesterolemia, unspecified: Secondary | ICD-10-CM | POA: Diagnosis not present

## 2023-07-18 DIAGNOSIS — N3281 Overactive bladder: Secondary | ICD-10-CM | POA: Diagnosis not present

## 2023-07-18 DIAGNOSIS — I10 Essential (primary) hypertension: Secondary | ICD-10-CM | POA: Diagnosis not present

## 2023-07-18 DIAGNOSIS — F411 Generalized anxiety disorder: Secondary | ICD-10-CM

## 2023-07-18 MED ORDER — OXYBUTYNIN CHLORIDE ER 5 MG PO TB24: 5.0000 mg | ORAL_TABLET | Freq: Every day | ORAL | 1 refills | Status: DC

## 2023-07-18 NOTE — Progress Notes (Signed)
OFFICE VISIT  07/18/2023  CC:  Chief Complaint  Patient presents with   Medical Management of Chronic Issues    Pt is fasting.     Patient is a 77 y.o. female who presents for f/u HTN, HLD, GAD. A/P as of last visit: "#1 hypertension, well controlled on amlodipine 5 mg a day, HCTZ 12.5 mg a day, and lisinopril 20 mg a day. She is also on 40 mill equivalents of potassium supplement daily. Check electrolytes and creatinine today.   2. hyperlipidemia, doing well on Crestor 20 mg a day. Lipid panel done in September 2023 showed total cholesterol 174, triglycerides 86, HDL 81, LDL 77. Lipid panel and hepatic panel today.   3.  GAD. Doing well on 1-1/2 of the 20 mg citalopram tabs daily. Her Xanax is prescribed by Dr. Sherol Dade.   #4 preventative health care: Vaccines: Flu and pneumonia UTD.   Cervical ca screening: Pap UTD 2023by GYN MD-->Tracey Snow. Breast ca screening: Mammogram at [facility] in [place] 03/2023 normal. Colon ca screening: Cologuard negative 04/2021.  Rpt approx 04/2024."  INTERIM HX: Tracey Snow is feeling good. She says the oxybutynin has helped her urinary urgency and frequency very well and she does want to continue it.     Past Medical History:  Diagnosis Date   Anxiety and depression    Carpal tunnel syndrome, bilateral    Cervical spinal stenosis    Chronic sinusitis    noted on MR imaging for MS   Colon cancer screening    Cologuard NEG 05/07/21   Ganglion cyst    R wrist   History of basal cell carcinoma (BCC) of skin    multiple sites   History of herpes zoster    back   History of SCC (squamous cell carcinoma) of skin    multiple sites   Hypercholesterolemia    Hypertension    Multiple sclerosis (HCC)    Dx'd 1998 betaseron helped but caused skin necrosis in arms.  Has been in longterm remission OFF of DMT as of 2023.   Osteoarthritis, multiple sites    c spine and L thumb   Restless legs     Past Surgical History:  Procedure Laterality  Date   25 GAUGE PARS PLANA VITRECTOMY WITH 20 GAUGE MVR PORT FOR MACULAR HOLE Left 04/06/2016   Procedure: 25 GAUGE PARS PLANA VITRECTOMY WITH 20 GAUGE MVR PORT FOR MACULAR HOLE;  Surgeon: Sherrie George, MD;  Location: [facility] OR;  Service: Ophthalmology;  Laterality: Left;   CATARACT EXTRACTION Left    COLONOSCOPY     2011   GAS/FLUID EXCHANGE Left 04/06/2016   Procedure: GAS/FLUID EXCHANGE;  Surgeon: Sherrie George, MD;  Location: [facility] OR;  Service: Ophthalmology;  Laterality: Left;   LASER PHOTO ABLATION Left 04/06/2016   Procedure: LASER PHOTO ABLATION;  Surgeon: Sherrie George, MD;  Location: [facility] OR;  Service: Ophthalmology;  Laterality: Left;   MEMBRANE PEEL Left 04/06/2016   Procedure: MEMBRANE PEEL;  Surgeon: Sherrie George, MD;  Location: [facility] OR;  Service: Ophthalmology;  Laterality: Left;   NASAL SINUS SURGERY     PARS PLANA VITRECTOMY W/ REPAIR OF MACULAR HOLE Left 04/06/2016   RETINAL DETACHMENT SURGERY     SERUM PATCH Left 04/06/2016   Procedure: SERUM PATCH;  Surgeon: Sherrie George, MD;  Location: [facility] OR;  Service: Ophthalmology;  Laterality: Left;   TUBAL LIGATION      Outpatient Medications Prior to Visit  Medication Sig Dispense Refill  acetaminophen (TYLENOL) 325 MG tablet Take 650 mg by mouth every 6 (six) hours as needed for mild pain.     ALPRAZolam (XANAX) 0.5 MG tablet TAKE 1 TABLET BY MOUTH THREE TIMES A DAY 90 tablet 5   amLODipine (NORVASC) 5 MG tablet Take 1 tablet (5 mg total) by mouth daily. 90 tablet 1   augmented betamethasone dipropionate (DIPROLENE-AF) 0.05 % cream Apply 1 drop topically 2 (two) times daily.     Cholecalciferol (D3 VITAMIN PO) Take by mouth daily.     citalopram (CELEXA) 20 MG tablet TAKE 1 AND 1/2 TABLETS ONCE DAILY 135 tablet 1   Cyanocobalamin (B-12 PO) Take by mouth daily.     hydrochlorothiazide (HYDRODIURIL) 25 MG tablet Take 0.5 tablets (12.5 mg total) by mouth daily. 90 tablet 1   lisinopril (ZESTRIL) 40 MG tablet Take 0.5 tablets  (20 mg total) by mouth daily. 90 tablet 1   potassium chloride SA (KLOR-CON M) 20 MEQ tablet Take 1 tablet (20 mEq total) by mouth 2 (two) times daily. 180 tablet 1   rosuvastatin (CRESTOR) 20 MG tablet TAKE 1 TABLET BY MOUTH EVERY DAY 30 tablet 0   tiZANidine (ZANAFLEX) 4 MG capsule TAKE 1 CAPSULE BY MOUTH EVERY DAY 90 capsule 1   VITAMIN C, CALCIUM ASCORBATE, PO Take by mouth daily.     nitrofurantoin, macrocrystal-monohydrate, (MACROBID) 100 MG capsule Take 1 capsule (100 mg total) by mouth 2 (two) times daily. 14 capsule 0   oxybutynin (DITROPAN XL) 5 MG 24 hr tablet Take 1 tablet (5 mg total) by mouth at bedtime. 30 tablet 0   No facility-administered medications prior to visit.    Allergies  Allergen Reactions   Sulfonamide Derivatives Rash and Other (See Comments)    DIFFICULTY SWALLOWING    Review of Systems As per HPI  PE:    07/18/2023    9:47 AM 06/17/2023    2:35 PM 06/15/2023   11:22 AM  Vitals with BMI  Weight 133 lbs 134 lbs 3 oz 135 lbs 3 oz  Systolic 136 123 119  Diastolic 78 78 78  Pulse 62 73 71     Physical Exam  Gen: Alert, well appearing.  Patient is oriented to person, place, time, and situation. AFFECT: pleasant, lucid thought and speech. No further exam today  LABS:  Last CBC Lab Results  Component Value Date   WBC 6.3 01/13/2023   HGB 12.8 01/13/2023   HCT 38.3 01/13/2023   MCV 93.0 01/13/2023   MCH 31.1 01/13/2023   RDW 13.8 01/13/2023   PLT 287 01/13/2023   Last metabolic panel Lab Results  Component Value Date   GLUCOSE 89 01/13/2023   NA 141 01/13/2023   K 3.4 (L) 01/13/2023   CL 102 01/13/2023   CO2 30 01/13/2023   BUN 14 01/13/2023   CREATININE 0.93 01/13/2023   EGFR 72 04/27/2021   CALCIUM 9.6 01/13/2023   PROT 7.3 01/13/2023   ALBUMIN 4.5 04/27/2021   BILITOT 0.5 01/13/2023   ALKPHOS 95 04/27/2021   AST 20 01/13/2023   ALT 24 01/13/2023   ANIONGAP 6 04/06/2016   Last lipids Lab Results  Component Value Date    CHOL 163 01/13/2023   HDL 84 01/13/2023   LDLCALC 63 01/13/2023   TRIG 77 01/13/2023   CHOLHDL 1.9 01/13/2023   IMPRESSION AND PLAN:  1 hypertension, well controlled on amlodipine 5 mg a day, HCTZ 12.5 mg a day, and lisinopril 20 mg a day. She is  also on 40 mill equivalents of potassium supplement daily. Check electrolytes and creatinine today.  #2 hypercholesterolemia, doing well on Crestor 20 mg a day. Last LDL was 63 about 6 months ago. Lipid panel and hepatic panel today.  #3 GAD. Doing well on citalopram 20 mg, 1-1/2 tabs a day.  She is prescribed alprazolam to use on the as needed basis by Dr. Sherol Dade.  #4 overactive bladder. Great response to Ditropan XL 5 mg a day.  Renewed prescription today.  #5 preventative health care: Vaccines: Flu and pneumonia UTD.   Cervical ca screening: Pap UTD 2023 by GYN MD-->Tracey Snow. Breast ca screening: Mammogram at [facility] in [place] 03/2023 normal. Colon ca screening: Cologuard negative 04/2021.  Rpt approx 04/2024.  An After Visit Summary was printed and given to the patient.  FOLLOW UP: Return in about 6 months (around 01/16/2024) for annual CPE (fasting).  Signed:  Santiago Bumpers, MD           07/18/2023

## 2023-07-19 LAB — COMPREHENSIVE METABOLIC PANEL
AG Ratio: 1.4 (calc) (ref 1.0–2.5)
ALT: 19 U/L (ref 6–29)
AST: 19 U/L (ref 10–35)
Albumin: 4.3 g/dL (ref 3.6–5.1)
Alkaline phosphatase (APISO): 80 U/L (ref 37–153)
BUN: 12 mg/dL (ref 7–25)
CO2: 31 mmol/L (ref 20–32)
Calcium: 9.4 mg/dL (ref 8.6–10.4)
Chloride: 102 mmol/L (ref 98–110)
Creat: 0.88 mg/dL (ref 0.60–1.00)
Globulin: 3.1 g/dL (ref 1.9–3.7)
Glucose, Bld: 83 mg/dL (ref 65–99)
Potassium: 3.7 mmol/L (ref 3.5–5.3)
Sodium: 140 mmol/L (ref 135–146)
Total Bilirubin: 0.5 mg/dL (ref 0.2–1.2)
Total Protein: 7.4 g/dL (ref 6.1–8.1)

## 2023-07-19 LAB — LIPID PANEL
Cholesterol: 179 mg/dL (ref <200)
HDL: 62 mg/dL (ref >=50)
LDL Cholesterol (Calc): 91 mg/dL
Non-HDL Cholesterol (Calc): 117 mg/dL (ref <130)
Total CHOL/HDL Ratio: 2.9 (calc) (ref <5.0)
Triglycerides: 160 mg/dL — ABNORMAL HIGH (ref <150)

## 2023-08-18 ENCOUNTER — Ambulatory Visit: Payer: Medicare HMO | Admitting: Neurology

## 2023-08-29 ENCOUNTER — Other Ambulatory Visit: Payer: Self-pay | Admitting: Family Medicine

## 2023-08-29 ENCOUNTER — Other Ambulatory Visit: Payer: Self-pay | Admitting: Neurology

## 2023-08-30 NOTE — Telephone Encounter (Signed)
 Requested Prescriptions   Pending Prescriptions Disp Refills   ALPRAZolam  (XANAX ) 0.5 MG tablet [Pharmacy Med Name: ALPRAZOLAM  0.5 MG TABLET] 90 tablet     Sig: TAKE 1 TABLET BY MOUTH THREE TIMES A DAY   Last seen 02/03/23, next appt scheduled 09/20/23  Dispenses    Dispensed Days Supply Quantity Provider Pharmacy  ALPRAZOLAM  0.5 MG TABLET 07/24/2023 30 90 each Gayland Lauraine PARAS, NP [facility] [phone] - M...  ALPRAZOLAM  0.5 MG TABLET 06/24/2023 30 90 each Gayland Lauraine PARAS, NP [facility] [phone] - M...  ALPRAZOLAM  0.5 MG TABLET 05/17/2023 30 90 each Gayland Lauraine PARAS, NP [facility] [phone] - M...  ALPRAZOLAM  0.5 MG TABLET 04/07/2023 30 90 each Gayland Lauraine PARAS, NP [facility] [phone] - M...  ALPRAZOLAM  0.5 MG TABLET 03/06/2023 30 90 each Gayland Lauraine PARAS, NP [facility] [phone] - M...  ALPRAZOLAM  0.5 MG TABLET 02/03/2023 30 90 each Gayland Lauraine PARAS, NP [facility] [phone] - M...  ALPRAZOLAM  0.5 MG TABLET 12/20/2022 30 90 each Sater, Charlie LABOR, MD [facility] [phone] - M...  ALPRAZOLAM  0.5 MG TABLET 11/17/2022 30 90 each Sater, Charlie LABOR, MD [facility] [phone] - M...  ALPRAZOLAM  0.5 MG TABLET 10/17/2022 30 90 each Sater, Charlie LABOR, MD [facility] [phone] - M...  ALPRAZOLAM  0.5 MG TABLET 09/19/2022 30 90 each Sater, Charlie LABOR, MD [facility] [phone] - M.SABRASABRA

## 2023-08-31 DIAGNOSIS — D225 Melanocytic nevi of trunk: Secondary | ICD-10-CM | POA: Diagnosis not present

## 2023-08-31 DIAGNOSIS — C44719 Basal cell carcinoma of skin of left lower limb, including hip: Secondary | ICD-10-CM | POA: Diagnosis not present

## 2023-08-31 DIAGNOSIS — Z85828 Personal history of other malignant neoplasm of skin: Secondary | ICD-10-CM | POA: Diagnosis not present

## 2023-08-31 DIAGNOSIS — Z08 Encounter for follow-up examination after completed treatment for malignant neoplasm: Secondary | ICD-10-CM | POA: Diagnosis not present

## 2023-08-31 DIAGNOSIS — C44722 Squamous cell carcinoma of skin of right lower limb, including hip: Secondary | ICD-10-CM | POA: Diagnosis not present

## 2023-08-31 DIAGNOSIS — L814 Other melanin hyperpigmentation: Secondary | ICD-10-CM | POA: Diagnosis not present

## 2023-08-31 DIAGNOSIS — L821 Other seborrheic keratosis: Secondary | ICD-10-CM | POA: Diagnosis not present

## 2023-08-31 DIAGNOSIS — Z7189 Other specified counseling: Secondary | ICD-10-CM | POA: Diagnosis not present

## 2023-08-31 DIAGNOSIS — D492 Neoplasm of unspecified behavior of bone, soft tissue, and skin: Secondary | ICD-10-CM | POA: Diagnosis not present

## 2023-08-31 DIAGNOSIS — D0471 Carcinoma in situ of skin of right lower limb, including hip: Secondary | ICD-10-CM | POA: Diagnosis not present

## 2023-09-13 DIAGNOSIS — D0471 Carcinoma in situ of skin of right lower limb, including hip: Secondary | ICD-10-CM | POA: Diagnosis not present

## 2023-09-20 ENCOUNTER — Encounter: Payer: Self-pay | Admitting: Neurology

## 2023-09-20 ENCOUNTER — Ambulatory Visit: Payer: Medicare HMO | Admitting: Neurology

## 2023-09-20 VITALS — BP 121/75 | HR 64 | Ht 62.0 in | Wt 134.0 lb

## 2023-09-20 DIAGNOSIS — F418 Other specified anxiety disorders: Secondary | ICD-10-CM | POA: Diagnosis not present

## 2023-09-20 DIAGNOSIS — I6381 Other cerebral infarction due to occlusion or stenosis of small artery: Secondary | ICD-10-CM

## 2023-09-20 DIAGNOSIS — G35 Multiple sclerosis: Secondary | ICD-10-CM

## 2023-09-20 DIAGNOSIS — R4189 Other symptoms and signs involving cognitive functions and awareness: Secondary | ICD-10-CM | POA: Diagnosis not present

## 2023-09-20 NOTE — Progress Notes (Signed)
 [facility]  PATIENT: Tracey Snow DOB: Nov 11, 1945  REFERRING DOCTOR OR PCP: Lorrene Hedges, MD [facility] neurology); Kip Corrington, MD(PCP) SOURCE: Patient, notes from Dr. Juliane, imaging and lab reports, MRI images personally reviewed.  _________________________________   HISTORICAL  CHIEF COMPLAINT:  Chief Complaint  Patient presents with   Room 11    Pt is here Alone. Pt states that she has been doing well. Pt states that her balance is off.     HISTORY OF PRESENT ILLNESS:  She is a 78 y.o. woman who was diagnosed with MS in 1998.   She has been off all DMTs since 2017.   She has had no exacerbations since discontinuing.   However, she notes mild worsening in balance.   MRI 03/09/2022 showed no new lesions compared to 2020  Gait is doing well and she mostly keeps up with her peers.  On vacation in [place], she had trouble with all the walking.   She uses the bannister on stairs and would not feel safe on bleachers or stairs without rails.   She stopped water  aerobics  after a skin cancer Moh's surgery.   She has no falls.   Her right leg is a little weaker than her left.  She has mild dysesthesias in the hands  Bladder function is stable.   She had a UTI in 2022.  She has urgency and rare incontinence, helped by oxybutynin ..    Her vision is stable.   No MS related issues   She uses reading glasses.  She denies much fatigue,   However, if she exercises she is tired afterwards.   She has some anxiety and mild depression.  She noes mild cognitive issues wih reduced word finding.   She has some anxiety and is on Xanax  0.5 mg 3 pills most days  It also helps her sleep  BP was elevated today but ride in was hectic.   She reports checking BP regularly at home and it is fine.    MS history: She was diagnosed with MS in 1998 after presenting with numbness form the waist down.  MRIs were consistent with MS.   She was referred to Dr. Juliane and Betaseron was  started.     Her MS did ok but she did have a couple relapses, one with poor balance and slurred speech.   She received IV Solu-medrol  for these.  In her 33's she was stable.   She stopped Betaseron in 2017 due to skin necrosis in both arms.   She has had no exacerbations though has noted mild progression.    IMAGING:  MRI brain 03/16/2022 showed T2/FLAIR hyperintense foci in the cerebral hemispheres, pons and left thalamus in a pattern consistent with chronic demyelinating plaque associated with multiple sclerosis, intermixed with chronic microvascular ischemic change.  None of the foci appear to be acute though there has been progression compared to the 2006 MRI. SABRA   No change compared to the MRI from 03/23/2019.   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  MRI of the brain 03/23/2019 shows multiple T2/FLAIR hyperintense foci in the periventricular, juxtacortical and deep white matter.  A couple small foci are noted in the pons and thalamus.  None of the foci appear to be acute.  They do not enhance.  According to the radiologist report, there is no change compared to the previous MRI.  From 2017.  Additionally, the patient has chronic sinusitis.  MRI of  the cervical spine 03/23/2019 shows degenerative changes C4-C5 through C6-C7 with spinal stenosis and various degrees of foraminal narrowing..  The spinal cord has normal signal.  The radiologist report states relatively unchanged compared to 12/09/2015.       REVIEW OF SYSTEMS: Constitutional: No fevers, chills, sweats, or change in appetite Eyes: No visual changes, double vision, eye pain Ear, nose and throat: No hearing loss, ear pain, nasal congestion, sore throat Cardiovascular: No chest pain, palpitations Respiratory:  No shortness of breath at rest or with exertion.   No wheezes GastrointestinaI: No nausea, vomiting, diarrhea, abdominal pain, fecal incontinence Genitourinary:  No dysuria, urinary retention or frequency.  No  nocturia. Musculoskeletal:  No neck pain, back pain Integumentary: No rash, pruritus, skin lesions Neurological: as above Psychiatric: No depression at this time.  No anxiety Endocrine: No palpitations, diaphoresis, change in appetite, change in weigh or increased thirst Hematologic/Lymphatic:  No anemia, purpura, petechiae. Allergic/Immunologic: No itchy/runny eyes, nasal congestion, recent allergic reactions, rashes  ALLERGIES: Allergies  Allergen Reactions   Sulfonamide Derivatives Rash and Other (See Comments)    DIFFICULTY SWALLOWING    HOME MEDICATIONS:  Current Outpatient Medications:    acetaminophen  (TYLENOL ) 325 MG tablet, Take 650 mg by mouth every 6 (six) hours as needed for mild pain., Disp: , Rfl:    ALPRAZolam  (XANAX ) 0.5 MG tablet, TAKE 1 TABLET BY MOUTH THREE TIMES A DAY, Disp: 90 tablet, Rfl: 3   amLODipine  (NORVASC ) 5 MG tablet, TAKE 1 TABLET (5 MG TOTAL) BY MOUTH DAILY., Disp: 90 tablet, Rfl: 1   Cholecalciferol (D3 VITAMIN PO), Take by mouth daily., Disp: , Rfl:    citalopram  (CELEXA ) 20 MG tablet, TAKE 1 AND 1/2 TABLETS ONCE DAILY, Disp: 135 tablet, Rfl: 1   Cyanocobalamin (B-12 PO), Take by mouth daily., Disp: , Rfl:    hydrochlorothiazide  (HYDRODIURIL ) 25 MG tablet, Take 0.5 tablets (12.5 mg total) by mouth daily., Disp: 90 tablet, Rfl: 1   lisinopril  (ZESTRIL ) 40 MG tablet, Take 0.5 tablets (20 mg total) by mouth daily., Disp: 90 tablet, Rfl: 1   oxybutynin  (DITROPAN  XL) 5 MG 24 hr tablet, Take 1 tablet (5 mg total) by mouth at bedtime., Disp: 90 tablet, Rfl: 1   potassium chloride  SA (KLOR-CON  M) 20 MEQ tablet, Take 1 tablet (20 mEq total) by mouth 2 (two) times daily., Disp: 180 tablet, Rfl: 1   rosuvastatin  (CRESTOR ) 20 MG tablet, TAKE 1 TABLET BY MOUTH EVERY DAY, Disp: 30 tablet, Rfl: 0   tiZANidine  (ZANAFLEX ) 4 MG capsule, TAKE 1 CAPSULE BY MOUTH EVERY DAY, Disp: 90 capsule, Rfl: 1   VITAMIN C, CALCIUM  ASCORBATE, PO, Take by mouth daily., Disp: , Rfl:     augmented betamethasone dipropionate (DIPROLENE-AF) 0.05 % cream, Apply 1 drop topically 2 (two) times daily., Disp: , Rfl:   PAST MEDICAL HISTORY: Past Medical History:  Diagnosis Date   Anxiety and depression    Carpal tunnel syndrome, bilateral    Cervical spinal stenosis    Chronic sinusitis    noted on MR imaging for MS   Colon cancer screening    Cologuard NEG 05/07/21   Ganglion cyst    R wrist   History of basal cell carcinoma (BCC) of skin    multiple sites   History of herpes zoster    back   History of SCC (squamous cell carcinoma) of skin    multiple sites   Hypercholesterolemia    Hypertension    Multiple sclerosis (HCC)  Dx'd 1998 betaseron helped but caused skin necrosis in arms.  Has been in longterm remission OFF of DMT as of 2023.   Osteoarthritis, multiple sites    c spine and L thumb   Restless legs     PAST SURGICAL HISTORY: Past Surgical History:  Procedure Laterality Date   25 GAUGE PARS PLANA VITRECTOMY WITH 20 GAUGE MVR PORT FOR MACULAR HOLE Left 04/06/2016   Procedure: 25 GAUGE PARS PLANA VITRECTOMY WITH 20 GAUGE MVR PORT FOR MACULAR HOLE;  Surgeon: Norleen JONETTA Ku, MD;  Location: [facility] OR;  Service: Ophthalmology;  Laterality: Left;   CATARACT EXTRACTION Left    COLONOSCOPY     2011   GAS/FLUID EXCHANGE Left 04/06/2016   Procedure: GAS/FLUID EXCHANGE;  Surgeon: Norleen JONETTA Ku, MD;  Location: [facility] OR;  Service: Ophthalmology;  Laterality: Left;   LASER PHOTO ABLATION Left 04/06/2016   Procedure: LASER PHOTO ABLATION;  Surgeon: Norleen JONETTA Ku, MD;  Location: [facility] OR;  Service: Ophthalmology;  Laterality: Left;   MEMBRANE PEEL Left 04/06/2016   Procedure: MEMBRANE PEEL;  Surgeon: Norleen JONETTA Ku, MD;  Location: [facility] OR;  Service: Ophthalmology;  Laterality: Left;   NASAL SINUS SURGERY     PARS PLANA VITRECTOMY W/ REPAIR OF MACULAR HOLE Left 04/06/2016   RETINAL DETACHMENT SURGERY     SERUM PATCH Left 04/06/2016   Procedure: SERUM PATCH;  Surgeon: Norleen JONETTA Ku, MD;  Location: [facility] OR;  Service: Ophthalmology;  Laterality: Left;   SKIN CANCER EXCISION Right 2024   TUBAL LIGATION      FAMILY HISTORY: Family History  Problem Relation Age of Onset   Throat cancer Mother    Alcoholism Father    Heart disease Father    Stroke Father    Diabetes Sister    Diabetes Brother    Breast cancer Neg Hx     SOCIAL HISTORY:  Social History   Socioeconomic History   Marital status: Divorced    Spouse name: Not on file   Number of children: 2   Years of education: Not on file   Highest education level: 12th grade  Occupational History   Not on file  Tobacco Use   Smoking status: Never   Smokeless tobacco: Never  Substance and Sexual Activity   Alcohol use: Yes    Comment: 04/06/2016 might have 1 glass of wine/month, if that   Drug use: No   Sexual activity: Never  Other Topics Concern   Not on file  Social History Narrative   Divorced, 2 children, 3 grandchildren.   She is from [place].   Educ: HS   Disability at one point d/t MS   No tob/alc   Social Drivers of Corporate Investment Banker Strain: Low Risk  (11/21/2022)   Overall Financial Resource Strain (CARDIA)    Difficulty of Paying Living Expenses: Not very hard  Food Insecurity: No Food Insecurity (11/21/2022)   Hunger Vital Sign    Worried About Running Out of Food in the Last Year: Never true    Ran Out of Food in the Last Year: Never true  Transportation Needs: No Transportation Needs (11/21/2022)   PRAPARE - Administrator, Civil Service (Medical): No    Lack of Transportation (Non-Medical): No  Physical Activity: Insufficiently Active (11/21/2022)   Exercise Vital Sign    Days of Exercise per Week: 2 days    Minutes of Exercise per Session: 50 min  Stress: No Stress Concern Present (11/21/2022)  [employer] of Occupational Health - Occupational Stress Questionnaire    Feeling of Stress : Only a little  Social Connections: Unknown (05/11/2023)    Received from [facility]   Social Network    Social Network: Not on file  Intimate Partner Violence: Unknown (05/11/2023)   Received from [facility]   HITS    Physically Hurt: Not on file    Insult or Talk Down To: Not on file    Threaten Physical Harm: Not on file    Scream or Curse: Not on file     PHYSICAL EXAM  Vitals:   09/20/23 1443  BP: 121/75  Pulse: 64  Weight: 134 lb (60.8 kg)  Height: 5' 2 (1.575 m)    Body mass index is 24.51 kg/m.   General: The patient is well-developed and well-nourished and in no acute distress  HEENT:  Head is /AT.  Sclera are anicteric.     Neck: The neck is nontender.but ROM is reduced  Skin: Extremities are without rash or  edema.  Neurologic Exam  Mental status: The patient is alert and oriented x 3 at the time of the examination. The patient has apparent normal recent and remote memory, with an apparently normal attention span and concentration ability.   Speech is normal.  Cranial nerves: Extraocular movements are full.  Facial strength and sensation was normal.. No obvious hearing deficits are noted.  Motor:  Muscle bulk is normal.   Tone is normal. Strength is  5 / 5 in all 4 extremities.   Sensory: Sensory testing is intact to pinprick, soft touch and vibration sensation in all 4 extremities.  Coordination: Cerebellar testing reveals good finger-nose-finger and heel-to-shin bilaterally.  Gait and station: Station is normal.   Gait  has reduced stride and reduced tandem gait Romberg is negative.   Reflexes: Deep tendon reflexes are symmetric and normal bilaterally.   Plantar responses are flexor.    DIAGNOSTIC DATA (LABS, IMAGING, TESTING) - I reviewed patient records, labs, notes, testing and imaging myself where available.  Lab Results  Component Value Date   WBC 6.3 01/13/2023   HGB 12.8 01/13/2023   HCT 38.3 01/13/2023   MCV 93.0 01/13/2023   PLT 287 01/13/2023      Component Value Date/Time   NA  140 07/18/2023 1011   NA 145 04/27/2021 0000   K 3.7 07/18/2023 1011   CL 102 07/18/2023 1011   CO2 31 07/18/2023 1011   GLUCOSE 83 07/18/2023 1011   BUN 12 07/18/2023 1011   BUN 11 04/27/2021 0000   CREATININE 0.88 07/18/2023 1011   CALCIUM  9.4 07/18/2023 1011   PROT 7.4 07/18/2023 1011   ALBUMIN 4.5 04/27/2021 0000   AST 19 07/18/2023 1011   ALT 19 07/18/2023 1011   ALKPHOS 95 04/27/2021 0000   BILITOT 0.5 07/18/2023 1011   GFRNONAA >60 04/06/2016 1017   GFRAA >60 04/06/2016 1017       ASSESSMENT AND PLAN  Multiple sclerosis (HCC)  Cognitive changes  Depression with anxiety  Lacunar infarction (HCC)   Her MS appears to be stable.  She has had no recent exacerbations.  The MRI showed no new lesions.  She can stay off disease modifying therapies.   Consider repeat MRI in 2026   MRI showed superimposed chronic microvascular ischemic changes as well as a lacunar infarction at the head of the caudate (not present on the 2006 MRI but seen on the 2020 MRI).  I advised her to take  81 mg aspirin daily. Stay active and exercise as tolerated. Continue Xanax  for anxiety.  Continue citalopram  for anxiety  she will return to see us  in 6 months or sooner if there are new or worsening symptoms.  This visit is part of a comprehensive longitudinal care medical relationship regarding the patients primary diagnosis of MS and related concerns.   Mckenna Boruff A. Vear, MD, [facility] 09/20/2023, 3:00 PM Certified in Neurology, Clinical Neurophysiology, Sleep Medicine and Neuroimaging  [facility] [address], [address] [place], [place] [address] [phone]

## 2023-10-10 DIAGNOSIS — C44722 Squamous cell carcinoma of skin of right lower limb, including hip: Secondary | ICD-10-CM | POA: Diagnosis not present

## 2023-10-25 DIAGNOSIS — C44719 Basal cell carcinoma of skin of left lower limb, including hip: Secondary | ICD-10-CM | POA: Diagnosis not present

## 2023-10-25 DIAGNOSIS — Z4802 Encounter for removal of sutures: Secondary | ICD-10-CM | POA: Diagnosis not present

## 2023-11-16 ENCOUNTER — Ambulatory Visit (INDEPENDENT_AMBULATORY_CARE_PROVIDER_SITE_OTHER): Payer: Medicare HMO | Admitting: *Deleted

## 2023-11-16 DIAGNOSIS — Z0000 Encounter for general adult medical examination without abnormal findings: Secondary | ICD-10-CM

## 2023-11-16 NOTE — Patient Instructions (Signed)
 [name] , Thank you for taking time to come for your Medicare Wellness Visit. I appreciate your ongoing commitment to your health goals. Please review the following plan we discussed and let me know if I can assist you in the future.   Screening recommendations/referrals: Colonoscopy: no longer required Bone Density: Education provided Recommended yearly ophthalmology/optometry visit for glaucoma screening and checkup Recommended yearly dental visit for hygiene and checkup  Vaccinations: Influenza vaccine: up to date Pneumococcal vaccine: up to date Tdap vaccine: Education provided Shingles vaccine: Education provided       Preventive Care 65 Years and Older, Female Preventive care refers to lifestyle choices and visits with your health care provider that can promote health and wellness. What does preventive care include? A yearly physical exam. This is also called an annual well check. Dental exams once or twice a year. Routine eye exams. Ask your health care provider how often you should have your eyes checked. Personal lifestyle choices, including: Daily care of your teeth and gums. Regular physical activity. Eating a healthy diet. Avoiding tobacco and drug use. Limiting alcohol use. Practicing safe sex. Taking low-dose aspirin every day. Taking vitamin and mineral supplements as recommended by your health care provider. What happens during an annual well check? The services and screenings done by your health care provider during your annual well check will depend on your age, overall health, lifestyle risk factors, and family history of disease. Counseling  Your health care provider may ask you questions about your: Alcohol use. Tobacco use. Drug use. Emotional well-being. Home and relationship well-being. Sexual activity. Eating habits. History of falls. Memory and ability to understand (cognition). Work and work Astronomer. Reproductive health. Screening   You may have the following tests or measurements: Height, weight, and BMI. Blood pressure. Lipid and cholesterol levels. These may be checked every 5 years, or more frequently if you are over 21 years old. Skin check. Lung cancer screening. You may have this screening every year starting at age 24 if you have a 30-pack-year history of smoking and currently smoke or have quit within the past 15 years. Fecal occult blood test (FOBT) of the stool. You may have this test every year starting at age 26. Flexible sigmoidoscopy or colonoscopy. You may have a sigmoidoscopy every 5 years or a colonoscopy every 10 years starting at age 79. Hepatitis C blood test. Hepatitis B blood test. Sexually transmitted disease (STD) testing. Diabetes screening. This is done by checking your blood sugar (glucose) after you have not eaten for a while (fasting). You may have this done every 1-3 years. Bone density scan. This is done to screen for osteoporosis. You may have this done starting at age 53. Mammogram. This may be done every 1-2 years. Talk to your health care provider about how often you should have regular mammograms. Talk with your health care provider about your test results, treatment options, and if necessary, the need for more tests. Vaccines  Your health care provider may recommend certain vaccines, such as: Influenza vaccine. This is recommended every year. Tetanus, diphtheria, and acellular pertussis (Tdap, Td) vaccine. You may need a Td booster every 10 years. Zoster vaccine. You may need this after age 26. Pneumococcal 13-valent conjugate (PCV13) vaccine. One dose is recommended after age 91. Pneumococcal polysaccharide (PPSV23) vaccine. One dose is recommended after age 30. Talk to your health care provider about which screenings and vaccines you need and how often you need them. This information is not intended to replace  advice given to you by your health care provider. Make sure you discuss  any questions you have with your health care provider. Document Released: 08/29/2015 Document Revised: 04/21/2016 Document Reviewed: 06/03/2015 Elsevier Interactive Patient Education  2017 [employer].  Fall Prevention in the Home Falls can cause injuries. They can happen to people of all ages. There are many things you can do to make your home safe and to help prevent falls. What can I do on the outside of my home? Regularly fix the edges of walkways and driveways and fix any cracks. Remove anything that might make you trip as you walk through a door, such as a raised step or threshold. Trim any bushes or trees on the path to your home. Use bright outdoor lighting. Clear any walking paths of anything that might make someone trip, such as rocks or tools. Regularly check to see if handrails are loose or broken. Make sure that both sides of any steps have handrails. Any raised decks and porches should have guardrails on the edges. Have any leaves, snow, or ice cleared regularly. Use sand or salt on walking paths during winter. Clean up any spills in your garage right away. This includes oil or grease spills. What can I do in the bathroom? Use night lights. Install grab bars by the toilet and in the tub and shower. Do not use towel bars as grab bars. Use non-skid mats or decals in the tub or shower. If you need to sit down in the shower, use a plastic, non-slip stool. Keep the floor dry. Clean up any water that spills on the floor as soon as it happens. Remove soap buildup in the tub or shower regularly. Attach bath mats securely with double-sided non-slip rug tape. Do not have throw rugs and other things on the floor that can make you trip. What can I do in the bedroom? Use night lights. Make sure that you have a light by your bed that is easy to reach. Do not use any sheets or blankets that are too big for your bed. They should not hang down onto the floor. Have a firm chair that has  side arms. You can use this for support while you get dressed. Do not have throw rugs and other things on the floor that can make you trip. What can I do in the kitchen? Clean up any spills right away. Avoid walking on wet floors. Keep items that you use a lot in easy-to-reach places. If you need to reach something above you, use a strong step stool that has a grab bar. Keep electrical cords out of the way. Do not use floor polish or wax that makes floors slippery. If you must use wax, use non-skid floor wax. Do not have throw rugs and other things on the floor that can make you trip. What can I do with my stairs? Do not leave any items on the stairs. Make sure that there are handrails on both sides of the stairs and use them. Fix handrails that are broken or loose. Make sure that handrails are as long as the stairways. Check any carpeting to make sure that it is firmly attached to the stairs. Fix any carpet that is loose or worn. Avoid having throw rugs at the top or bottom of the stairs. If you do have throw rugs, attach them to the floor with carpet tape. Make sure that you have a light switch at the top of the stairs and the bottom  of the stairs. If you do not have them, ask someone to add them for you. What else can I do to help prevent falls? Wear shoes that: Do not have high heels. Have rubber bottoms. Are comfortable and fit you well. Are closed at the toe. Do not wear sandals. If you use a stepladder: Make sure that it is fully opened. Do not climb a closed stepladder. Make sure that both sides of the stepladder are locked into place. Ask someone to hold it for you, if possible. Clearly mark and make sure that you can see: Any grab bars or handrails. First and last steps. Where the edge of each step is. Use tools that help you move around (mobility aids) if they are needed. These include: Canes. Walkers. Scooters. Crutches. Turn on the lights when you go into a dark area.  Replace any light bulbs as soon as they burn out. Set up your furniture so you have a clear path. Avoid moving your furniture around. If any of your floors are uneven, fix them. If there are any pets around you, be aware of where they are. Review your medicines with your doctor. Some medicines can make you feel dizzy. This can increase your chance of falling. Ask your doctor what other things that you can do to help prevent falls. This information is not intended to replace advice given to you by your health care provider. Make sure you discuss any questions you have with your health care provider. Document Released: 05/29/2009 Document Revised: 01/08/2016 Document Reviewed: 09/06/2014 Elsevier Interactive Patient Education  2017 [employer].

## 2023-11-16 NOTE — Progress Notes (Signed)
 Subjective:   Tracey Snow is a 78 y.o. female who presents for Medicare Annual (Subsequent) preventive examination.  Visit Complete: Virtual I connected with  Raford Pitcher on 11/16/23 by a audio enabled telemedicine application and verified that I am speaking with the correct person using two identifiers.  Patient Location: Home  Provider Location: Home Office  I discussed the limitations of evaluation and management by telemedicine. The patient expressed understanding and agreed to proceed.  Vital Signs: Because this visit was a virtual/telehealth visit, some criteria may be missing or patient reported. Any vitals not documented were not able to be obtained and vitals that have been documented are patient reported.  Cardiac Risk Factors include: advanced age (>56men, >47 women);hypertension     Objective:    There were no vitals filed for this visit. There is no height or weight on file to calculate BMI.     11/16/2023    1:19 PM 10/06/2022   11:03 AM 04/06/2016    4:51 PM 04/06/2016   10:44 AM  Advanced Directives  Does Patient Have a Medical Advance Directive? Yes Yes  Yes  Type of Estate agent of [employer] Power of [place];Living will  Healthcare Power of [place];Living will  Copy of Healthcare Power of Attorney in Chart? Yes - validated most recent copy scanned in chart (See row information) No - copy requested No - copy requested Yes    Current Medications (verified) Outpatient Encounter Medications as of 11/16/2023  Medication Sig   acetaminophen (TYLENOL) 325 MG tablet Take 650 mg by mouth every 6 (six) hours as needed for mild pain.   ALPRAZolam (XANAX) 0.5 MG tablet TAKE 1 TABLET BY MOUTH THREE TIMES A DAY   amLODipine (NORVASC) 5 MG tablet TAKE 1 TABLET (5 MG TOTAL) BY MOUTH DAILY.   Cholecalciferol (D3 VITAMIN PO) Take by mouth daily.   citalopram (CELEXA) 20 MG tablet TAKE 1 AND 1/2 TABLETS ONCE DAILY   Cyanocobalamin  (B-12 PO) Take by mouth daily.   hydrochlorothiazide (HYDRODIURIL) 25 MG tablet Take 0.5 tablets (12.5 mg total) by mouth daily.   lisinopril (ZESTRIL) 40 MG tablet Take 0.5 tablets (20 mg total) by mouth daily.   oxybutynin (DITROPAN XL) 5 MG 24 hr tablet Take 1 tablet (5 mg total) by mouth at bedtime.   potassium chloride SA (KLOR-CON M) 20 MEQ tablet Take 1 tablet (20 mEq total) by mouth 2 (two) times daily.   rosuvastatin (CRESTOR) 20 MG tablet TAKE 1 TABLET BY MOUTH EVERY DAY   tiZANidine (ZANAFLEX) 4 MG capsule TAKE 1 CAPSULE BY MOUTH EVERY DAY   VITAMIN C, CALCIUM ASCORBATE, PO Take by mouth daily.   No facility-administered encounter medications on file as of 11/16/2023.    Allergies (verified) Sulfonamide derivatives   History: Past Medical History:  Diagnosis Date   Anxiety and depression    Carpal tunnel syndrome, bilateral    Cervical spinal stenosis    Chronic sinusitis    noted on MR imaging for MS   Colon cancer screening    Cologuard NEG 05/07/21   Ganglion cyst    R wrist   History of basal cell carcinoma (BCC) of skin    multiple sites   History of herpes zoster    back   History of SCC (squamous cell carcinoma) of skin    multiple sites   Hypercholesterolemia    Hypertension    Multiple sclerosis (HCC)    Dx'd 1998 betaseron helped but  caused skin necrosis in arms.  Has been in longterm remission OFF of DMT as of 2023.   Osteoarthritis, multiple sites    c spine and L thumb   Restless legs    Past Surgical History:  Procedure Laterality Date   25 GAUGE PARS PLANA VITRECTOMY WITH 20 GAUGE MVR PORT FOR MACULAR HOLE Left 04/06/2016   Procedure: 25 GAUGE PARS PLANA VITRECTOMY WITH 20 GAUGE MVR PORT FOR MACULAR HOLE;  Surgeon: Sherrie George, MD;  Location: [facility] OR;  Service: Ophthalmology;  Laterality: Left;   CATARACT EXTRACTION Left    COLONOSCOPY     2011   GAS/FLUID EXCHANGE Left 04/06/2016   Procedure: GAS/FLUID EXCHANGE;  Surgeon: Sherrie George, MD;   Location: [facility] OR;  Service: Ophthalmology;  Laterality: Left;   LASER PHOTO ABLATION Left 04/06/2016   Procedure: LASER PHOTO ABLATION;  Surgeon: Sherrie George, MD;  Location: [facility] OR;  Service: Ophthalmology;  Laterality: Left;   MEMBRANE PEEL Left 04/06/2016   Procedure: MEMBRANE PEEL;  Surgeon: Sherrie George, MD;  Location: [facility] OR;  Service: Ophthalmology;  Laterality: Left;   NASAL SINUS SURGERY     PARS PLANA VITRECTOMY W/ REPAIR OF MACULAR HOLE Left 04/06/2016   RETINAL DETACHMENT SURGERY     SERUM PATCH Left 04/06/2016   Procedure: SERUM PATCH;  Surgeon: Sherrie George, MD;  Location: [facility] OR;  Service: Ophthalmology;  Laterality: Left;   SKIN CANCER EXCISION Right 2024   TUBAL LIGATION     Family History  Problem Relation Age of Onset   Throat cancer Mother    Alcoholism Father    Heart disease Father    Stroke Father    Diabetes Sister    Diabetes Brother    Breast cancer Neg Hx    Social History   Socioeconomic History   Marital status: Divorced    Spouse name: Not on file   Number of children: 2   Years of education: Not on file   Highest education level: 12th grade  Occupational History   Not on file  Tobacco Use   Smoking status: Never   Smokeless tobacco: Never  Substance and Sexual Activity   Alcohol use: Yes    Comment: 04/06/2016 "might have 1 glass of wine/month, if that"   Drug use: No   Sexual activity: Never  Other Topics Concern   Not on file  Social History Narrative   Divorced, 2 children, 3 grandchildren.   She is from [place]   Educ: HS   Disability at one point d/t MS   No tob/alc   Social Drivers of Corporate investment banker Strain: Low Risk  (11/16/2023)   Overall Financial Resource Strain (CARDIA)    Difficulty of Paying Living Expenses: Not very hard  Food Insecurity: No Food Insecurity (11/16/2023)   Hunger Vital Sign    Worried About Running Out of Food in the Last Year: Never true    Ran Out of Food in the Last Year: Never true   Transportation Needs: No Transportation Needs (11/16/2023)   PRAPARE - Administrator, Civil Service (Medical): No    Lack of Transportation (Non-Medical): No  Physical Activity: Insufficiently Active (11/16/2023)   Exercise Vital Sign    Days of Exercise per Week: 2 days    Minutes of Exercise per Session: 30 min  Stress: No Stress Concern Present (11/16/2023)   [employer] of Occupational Health - Occupational Stress Questionnaire    Feeling of  Stress : Only a little  Social Connections: Moderately Integrated (11/16/2023)   Social Connection and Isolation Panel [NHANES]    Frequency of Communication with Friends and Family: Twice a week    Frequency of Social Gatherings with Friends and Family: Three times a week    Attends Religious Services: More than 4 times per year    Active Member of Clubs or Organizations: Yes    Attends Banker Meetings: More than 4 times per year    Marital Status: Divorced  Recent Concern: Social Connections - Moderately Isolated (11/16/2023)   Social Connection and Isolation Panel [NHANES]    Frequency of Communication with Friends and Family: Three times a week    Frequency of Social Gatherings with Friends and Family: Once a week    Attends Religious Services: More than 4 times per year    Active Member of [employer] or Organizations: No    Attends Engineer, structural: Never    Marital Status: Separated    Tobacco Counseling Counseling given: Not Answered   Clinical Intake:  Pre-visit preparation completed: Yes  Pain : No/denies pain     Diabetes: No  How often do you need to have someone help you when you read instructions, pamphlets, or other written materials from your doctor or pharmacy?: 1 - Never  Interpreter Needed?: No  Information entered by :: Remi Haggard LPN   Activities of Daily Living    11/16/2023    1:20 PM  In your present state of health, do you have any difficulty performing the following  activities:  Hearing? 0  Vision? 0  Difficulty concentrating or making decisions? 0  Walking or climbing stairs? 0  Dressing or bathing? 0  Doing errands, shopping? 0  Preparing Food and eating ? N  Using the Toilet? N  In the past six months, have you accidently leaked urine? N  Do you have problems with loss of bowel control? N  Managing your Medications? N  Managing your Finances? N  Housekeeping or managing your Housekeeping? N    Patient Care Team: Jeoffrey Massed, MD as PCP - General (Family Medicine) Epimenio Foot, Pearletha Furl, MD as Consulting Physician (Neurology) Ernestina Penna, MD as Consulting Physician (Obstetrics and Gynecology) Sherrie George, MD as Consulting Physician (Ophthalmology) Cherlyn Roberts, MD as Consulting Physician (Dermatology)  Indicate any recent Medical Services you may have received from other than [facility] providers in the past year (date may be approximate).     Assessment:   This is a routine wellness examination for Madalina.  Hearing/Vision screen Hearing Screening - Comments:: No trouble hearing Vision Screening - Comments:: Up to date [facility]    Goals Addressed             This Visit's Progress    Patient Stated   On track    Stay healthy      Patient Stated       Stay healthy       Depression Screen    11/16/2023    1:25 PM 11/22/2022    1:58 PM 10/06/2022   11:02 AM 07/15/2022   10:03 AM  PHQ 2/9 Scores  PHQ - 2 Score 0 1 1 2   PHQ- 9 Score 1 1      Fall Risk    11/16/2023    1:18 PM 11/22/2022    1:58 PM 11/21/2022    2:52 PM 10/06/2022   11:05 AM 07/15/2022   10:04 AM  Fall Risk  Falls in the past year? 0 0 0 0 0  Number falls in past yr: 0 0  0 0  Injury with Fall? 0 0  0 0  Risk for fall due to : Impaired balance/gait No Fall Risks  Impaired vision;Impaired balance/gait No Fall Risks  Follow up Falls evaluation completed;Education provided;Falls prevention discussed Falls evaluation completed  Falls prevention  discussed     MEDICARE RISK AT HOME: Medicare Risk at Home Any stairs in or around the home?: No If so, are there any without handrails?: No Adequate lighting in your home to reduce risk of falls?: Yes Life alert?: No Use of a cane, walker or w/c?: No Grab bars in the bathroom?: Yes Shower chair or bench in shower?: No Elevated toilet seat or a handicapped toilet?: No  TIMED UP AND GO:  Was the test performed?  No    Cognitive Function:      02/03/2023   12:40 PM  Montreal Cognitive Assessment   Visuospatial/ Executive (0/5) 0  Naming (0/3) 3  Attention: Read list of digits (0/2) 2  Attention: Read list of letters (0/1) 1  Attention: Serial 7 subtraction starting at 100 (0/3) 1  Language: Repeat phrase (0/2) 1  Language : Fluency (0/1) 0  Abstraction (0/2) 2  Delayed Recall (0/5) 4  Orientation (0/6) 6  Total 20  Adjusted Score (based on education) 20      11/16/2023    1:22 PM 10/06/2022   11:06 AM  6CIT Screen  What Year? 0 points 0 points  What month? 0 points 0 points  What time? 0 points 0 points  Count back from 20 0 points 0 points  Months in reverse 0 points 0 points  Repeat phrase 2 points 0 points  Total Score 2 points 0 points    Immunizations Immunization History  Administered Date(s) Administered   Fluad Quad(high Dose 65+) 05/06/2022   Influenza,inj,Quad PF,6+ Mos 07/01/2014   Influenza-Unspecified 07/01/2014, 07/21/2020   PNEUMOCOCCAL CONJUGATE-20 05/06/2022    TDAP status: Due, Education has been provided regarding the importance of this vaccine. Advised may receive this vaccine at local pharmacy or Health Dept. Aware to provide a copy of the vaccination record if obtained from local pharmacy or Health Dept. Verbalized acceptance and understanding.  Flu Vaccine status: Up to date  Pneumococcal vaccine status: Up to date  Covid-19 vaccine status: Information provided on how to obtain vaccines.   Qualifies for Shingles Vaccine? Yes    Zostavax completed No   Shingrix Completed?: No.    Education has been provided regarding the importance of this vaccine. Patient has been advised to call insurance company to determine out of pocket expense if they have not yet received this vaccine. Advised may also receive vaccine at local pharmacy or Health Dept. Verbalized acceptance and understanding.  Screening Tests Health Maintenance  Topic Date Due   COVID-19 Vaccine (1) Never done   Hepatitis C Screening  Never done   DTaP/Tdap/Td (1 - Tdap) Never done   Zoster Vaccines- Shingrix (1 of 2) Never done   DEXA SCAN  Never done   INFLUENZA VACCINE  03/16/2024   Medicare Annual Wellness (AWV)  11/15/2024   Pneumonia Vaccine 17+ Years old  Completed   HPV VACCINES  Aged Out   Colonoscopy  Discontinued    Health Maintenance  Health Maintenance Due  Topic Date Due   COVID-19 Vaccine (1) Never done   Hepatitis C Screening  Never done   DTaP/Tdap/Td (1 -  Tdap) Never done   Zoster Vaccines- Shingrix (1 of 2) Never done   DEXA SCAN  Never done    Colorectal cancer screening: No longer required.   Mammogram status: No longer required due to  .  Bone Density  Education provided  Lung Cancer Screening: (Low Dose CT Chest recommended if Age 48-80 years, 20 pack-year currently smoking OR have quit w/in 15years.) does not qualify.   Lung Cancer Screening Referral:   Additional Screening:  Hepatitis C Screening  never done   Vision Screening: Recommended annual ophthalmology exams for early detection of glaucoma and other disorders of the eye. Is the patient up to date with their annual eye exam?  Yes  Who is the provider or what is the name of the office in which the patient attends annual eye exams? [facility] If pt is not established with a provider, would they like to be referred to a provider to establish care? No .   Dental Screening: Recommended annual dental exams for proper oral hygiene   Community Resource Referral  / Chronic Care Management: CRR required this visit?  No   CCM required this visit?  No     Plan:     I have personally reviewed and noted the following in the patient's chart:   Medical and social history Use of alcohol, tobacco or illicit drugs  Current medications and supplements including opioid prescriptions. Patient is not currently taking opioid prescriptions. Functional ability and status Nutritional status Physical activity Advanced directives List of other physicians Hospitalizations, surgeries, and ER visits in previous 12 months Vitals Screenings to include cognitive, depression, and falls Referrals and appointments  In addition, I have reviewed and discussed with patient certain preventive protocols, quality metrics, and best practice recommendations. A written personalized care plan for preventive services as well as general preventive health recommendations were provided to patient.     Remi Haggard, LPN   08/21/1094   After Visit Summary: (MyChart) Due to this being a telephonic visit, the after visit summary with patients personalized plan was offered to patient via MyChart   Nurse Notes:

## 2023-11-17 ENCOUNTER — Other Ambulatory Visit: Payer: Self-pay | Admitting: Family Medicine

## 2023-11-22 ENCOUNTER — Other Ambulatory Visit: Payer: Self-pay | Admitting: Family Medicine

## 2023-11-22 MED ORDER — ROSUVASTATIN CALCIUM 20 MG PO TABS: 20.0000 mg | ORAL_TABLET | Freq: Every day | ORAL | 0 refills | Status: DC

## 2023-11-22 NOTE — Telephone Encounter (Signed)
 Copied from CRM [id number]. Topic: Clinical - Medication Refill >> Nov 22, 2023  9:28 AM Truddie Crumble wrote: Most Recent Primary Care Visit:  Provider: Laurey Arrow  Department: [facility]  Visit Type: MEDICARE AWV, SEQUENTIAL  Date: 11/16/2023  Medication: rosuvastatin (CRESTOR) 20 MG tablet  Has the patient contacted their pharmacy? Yes (Agent: If no, request that the patient contact the pharmacy for the refill. If patient does not wish to contact the pharmacy document the reason why and proceed with request.) (Agent: If yes, when and what did the pharmacy advise?)  Is this the correct pharmacy for this prescription? Yes If no, delete pharmacy and type the correct one.  This is the patient's preferred pharmacy:  [id number] - [place], [place] - [address] [place] [address] Phone: [phone] Fax: [phone]   Has the prescription been filled recently? No  Is the patient out of the medication? Yes  Has the patient been seen for an appointment in the last year OR does the patient have an upcoming appointment? Yes  Can we respond through MyChart? Yes  Agent: Please be advised that Rx refills may take up to 3 business days. We ask that you follow-up with your pharmacy.

## 2023-11-22 NOTE — Telephone Encounter (Signed)
 Last Fill: 07/01/23 30 tabs/0 RF  Last OV: 07/18/23 Next OV: 01/17/24  Routing to provider for review/authorization.

## 2023-12-18 ENCOUNTER — Other Ambulatory Visit: Payer: Self-pay | Admitting: Family Medicine

## 2024-01-04 ENCOUNTER — Other Ambulatory Visit: Payer: Self-pay | Admitting: Family Medicine

## 2024-01-04 DIAGNOSIS — Z1231 Encounter for screening mammogram for malignant neoplasm of breast: Secondary | ICD-10-CM

## 2024-01-13 ENCOUNTER — Encounter: Payer: Self-pay | Admitting: Family Medicine

## 2024-01-13 ENCOUNTER — Ambulatory Visit: Payer: Self-pay

## 2024-01-13 ENCOUNTER — Telehealth: Admitting: Family Medicine

## 2024-01-13 VITALS — Wt 130.0 lb

## 2024-01-13 DIAGNOSIS — R42 Dizziness and giddiness: Secondary | ICD-10-CM

## 2024-01-13 DIAGNOSIS — J0100 Acute maxillary sinusitis, unspecified: Secondary | ICD-10-CM | POA: Diagnosis not present

## 2024-01-13 MED ORDER — AMOXICILLIN 875 MG PO TABS: 875.0000 mg | ORAL_TABLET | Freq: Two times a day (BID) | ORAL | 0 refills | Status: AC

## 2024-01-13 NOTE — Progress Notes (Signed)
 Virtual Visit via Video Note  I connected with Tracey Snow  on 01/13/24 at  4:20 PM EDT by a video enabled telemedicine application and verified that I am speaking with the correct person using two identifiers.  Location patient: [place] Location provider:work Snow home office Persons participating in the virtual visit: patient, provider  I discussed the limitations and requested verbal permission for telemedicine visit. The patient expressed understanding and agreed to proceed.   HPI: 78 year old female being seen today for sinus congestion. 2 weeks ago Tracey Snow started to get nasal congestion, nasal mucus, sneezing, sinus pressure.  Over the last week symptoms have progressed to the point of bilateral sinus pain, left greater than right, upper teeth pain, mucus purulent. No significant sore throat Snow cough.  No fever.  She is using Flonase and saline nasal spray as well as doing some sinus rinses/Neti pot. No nausea Snow vomiting Snow diarrhea. She has felt some dizziness the last couple of days, mainly when she stands up.  No vertigo. No palpitations. She has not checked her blood pressure lately but says historically at home it is normal.  PMP AWARE reviewed today: most recent rx for alprazolam  was filled 01/01/2024, # 90, rx by Dr. Hortensia Snow. No red flags.   ROS: See pertinent positives and negatives per HPI.  Past Medical History:  Diagnosis Date   Anxiety and depression    Carpal tunnel syndrome, bilateral    Cervical spinal stenosis    Chronic sinusitis    noted on MR imaging for MS   Colon cancer screening    Cologuard NEG 05/07/21   Ganglion cyst    R wrist   History of basal cell carcinoma (BCC) of skin    multiple sites   History of herpes zoster    back   History of SCC (squamous cell carcinoma) of skin    multiple sites   Hypercholesterolemia    Hypertension    Multiple sclerosis (HCC)    Dx'd 1998 betaseron helped but caused skin necrosis in arms.  Has been in  longterm remission OFF of DMT as of 2023.   Osteoarthritis, multiple sites    c spine and L thumb   Restless legs     Past Surgical History:  Procedure Laterality Date   25 GAUGE PARS PLANA VITRECTOMY WITH 20 GAUGE MVR PORT FOR MACULAR HOLE Left 04/06/2016   Procedure: 25 GAUGE PARS PLANA VITRECTOMY WITH 20 GAUGE MVR PORT FOR MACULAR HOLE;  Surgeon: Tracey Catching, MD;  Location: [facility];  Service: Ophthalmology;  Laterality: Left;   CATARACT EXTRACTION Left    COLONOSCOPY     2011   GAS/FLUID EXCHANGE Left 04/06/2016   Procedure: GAS/FLUID EXCHANGE;  Surgeon: Tracey Catching, MD;  Location: [facility];  Service: Ophthalmology;  Laterality: Left;   LASER PHOTO ABLATION Left 04/06/2016   Procedure: LASER PHOTO ABLATION;  Surgeon: Tracey Catching, MD;  Location: [facility];  Service: Ophthalmology;  Laterality: Left;   MEMBRANE PEEL Left 04/06/2016   Procedure: MEMBRANE PEEL;  Surgeon: Tracey Catching, MD;  Location: [facility];  Service: Ophthalmology;  Laterality: Left;   NASAL SINUS SURGERY     PARS PLANA VITRECTOMY W/ REPAIR OF MACULAR HOLE Left 04/06/2016   RETINAL DETACHMENT SURGERY     SERUM PATCH Left 04/06/2016   Procedure: SERUM PATCH;  Surgeon: Tracey Catching, MD;  Location: [facility];  Service: Ophthalmology;  Laterality: Left;   SKIN CANCER EXCISION Right 2024   TUBAL LIGATION  Current Outpatient Medications:    acetaminophen  (TYLENOL ) 325 MG tablet, Take 650 mg by mouth every 6 (six) hours as needed for mild pain., Disp: , Rfl:    ALPRAZolam  (XANAX ) 0.5 MG tablet, TAKE 1 TABLET BY MOUTH THREE TIMES A DAY, Disp: 90 tablet, Rfl: 3   amLODipine  (NORVASC ) 5 MG tablet, TAKE 1 TABLET (5 MG TOTAL) BY MOUTH DAILY., Disp: 90 tablet, Rfl: 1   amoxicillin (AMOXIL) 875 MG tablet, Take 1 tablet (875 mg total) by mouth 2 (two) times daily for 10 days., Disp: 20 tablet, Rfl: 0   citalopram  (CELEXA ) 20 MG tablet, TAKE 1 AND 1/2 TABLETS ONCE DAILY, Disp: 135 tablet, Rfl: 0   hydrochlorothiazide   (HYDRODIURIL ) 25 MG tablet, Take 0.5 tablets (12.5 mg total) by mouth daily., Disp: 90 tablet, Rfl: 1   lisinopril  (ZESTRIL ) 40 MG tablet, TAKE 1/2 TABLET BY MOUTH DAILY, Disp: 45 tablet, Rfl: 0   oxybutynin  (DITROPAN  XL) 5 MG 24 hr tablet, Take 1 tablet (5 mg total) by mouth at bedtime., Disp: 90 tablet, Rfl: 1   potassium chloride  SA (KLOR-CON  M) 20 MEQ tablet, TAKE 1 TABLET BY MOUTH TWICE A DAY, Disp: 180 tablet, Rfl: 0   rosuvastatin  (CRESTOR ) 20 MG tablet, TAKE 1 TABLET BY MOUTH EVERY DAY, Disp: 30 tablet, Rfl: 0   tiZANidine  (ZANAFLEX ) 4 MG capsule, TAKE 1 CAPSULE BY MOUTH EVERY DAY, Disp: 90 capsule, Rfl: 1   VITAMIN C, CALCIUM  ASCORBATE, PO, Take by mouth daily., Disp: , Rfl:    Cholecalciferol (D3 VITAMIN PO), Take by mouth daily. (Patient not taking: Reported on 01/13/2024), Disp: , Rfl:    Cyanocobalamin (B-12 PO), Take by mouth daily. (Patient not taking: Reported on 01/13/2024), Disp: , Rfl:    EXAM:  VITALS per patient if applicable:      01/13/2024    4:08 PM 09/20/2023    2:43 PM 07/18/2023    9:47 AM  Vitals with BMI  Height  5\' 2"    Weight 130 lbs 134 lbs 133 lbs  BMI  24.5   Systolic  121 136  Diastolic  75 78  Pulse  64 62     GENERAL: alert, oriented, appears well and in no acute distress  HEENT: atraumatic, conjunttiva clear, no obvious abnormalities on inspection of external nose and ears  NECK: normal movements of the head and neck  LUNGS: on inspection no signs of respiratory distress, breathing rate appears normal, no obvious gross SOB, gasping Snow wheezing  CV: no obvious cyanosis  MS: moves all visible extremities without noticeable abnormality  PSYCH/NEURO: pleasant and cooperative, no obvious depression Snow anxiety, speech and thought processing grossly intact  LABS: none today    Chemistry      Component Value Date/Time   NA 140 07/18/2023 1011   NA 145 04/27/2021 0000   K 3.7 07/18/2023 1011   CL 102 07/18/2023 1011   CO2 31 07/18/2023 1011    BUN 12 07/18/2023 1011   BUN 11 04/27/2021 0000   CREATININE 0.88 07/18/2023 1011   GLU 93 04/27/2021 0000      Component Value Date/Time   CALCIUM  9.4 07/18/2023 1011   ALKPHOS 95 04/27/2021 0000   AST 19 07/18/2023 1011   ALT 19 07/18/2023 1011   BILITOT 0.5 07/18/2023 1011     ASSESSMENT AND PLAN:  Discussed the following assessment and plan:  Acute sinusitis. Amoxicillin 875 twice daily x 10 days. Continue Flonase and saline nasal rinses. I encouraged her to monitor her  blood pressure and to hold blood pressure medication if less than 110/70.  She states she is hydrating pretty well but not hungry lately.  I discussed the assessment and treatment plan with the patient. The patient was provided an opportunity to ask questions and all were answered. The patient agreed with the plan and demonstrated an understanding of the instructions.   F/u: Keep appointment already set for next week  Signed:  Arletha Lady, MD           01/13/2024

## 2024-01-13 NOTE — Telephone Encounter (Signed)
 Please Advise

## 2024-01-13 NOTE — Telephone Encounter (Signed)
 I will see her as a virtual add-on this afternoon if she can do that.

## 2024-01-13 NOTE — Telephone Encounter (Signed)
 Copied from CRM [id number]. Topic: Clinical - Red Word Triage >> Jan 13, 2024  8:40 AM Alyse July wrote: Red Word that prompted transfer to Nurse Triage:Increase mucus and dizziness.  Chief Complaint: congestion, dizzy Symptoms: increased nasal congestion and drainage - yellow sputum dizzy Frequency: 2 weeks Pertinent Negatives: Patient denies fever Disposition: [] ED /[] Urgent Care (no appt availability in office) / [] Appointment(In office/virtual)/ []  [facility]/ [] Home Care/ [] Refused Recommended Disposition /[] [facility]/ [x]  Follow-up with PCP Additional Notes: pt states history of sinus issues and normally they are not this bad - however right now pt has been treated from home and not getting better. some nausea.  Offered to scheduled in office appt but patient refused due to dizziness.  Pt would like to see if PCP can call in an ABX for sinus infection.  Reason for Disposition  Dizziness caused by not drinking enough fluids    Pt states dizziness is r/t sinuses  [1] Sinus congestion as part of a cold AND [2] present < 10 days  Answer Assessment - Initial Assessment Questions 1. LOCATION: "Where does it hurt?"      Nasal congestion 2. ONSET: "When did the sinus pain start?"  (e.g., hours, days)      2 weeks 3. SEVERITY: "How bad is the pain?"   (Scale 1-10; mild, moderate or severe)   - MILD (1-3): doesn't interfere with normal activities    - MODERATE (4-7): interferes with normal activities (e.g., work or school) or awakens from sleep   - SEVERE (8-10): excruciating pain and patient unable to do any normal activities        severe 4. RECURRENT SYMPTOM: "Have you ever had sinus problems before?" If Yes, ask: "When was the last time?" and "What happened that time?"      yes 5. NASAL CONGESTION: "Is the nose blocked?" If Yes, ask: "Can you open it or must you breathe through your mouth?"     Congestion, mucous  6. NASAL DISCHARGE: "Do you have discharge from  your nose?" If so ask, "What color?"     yellowish 7. FEVER: "Do you have a fever?" If Yes, ask: "What is it, how was it measured, and when did it start?"      no 8. OTHER SYMPTOMS: "Do you have any other symptoms?" (e.g., sore throat, cough, earache, difficulty breathing) Left  ear pops when blowing nose 9. PREGNANCY: "Is there any chance you are pregnant?" "When was your last menstrual period?"     N/a  Answer Assessment - Initial Assessment Questions 1. DESCRIPTION: "Describe your dizziness."     dizzy 2. LIGHTHEADED: "Do you feel lightheaded?" (e.g., somewhat faint, woozy, weak upon standing)     lightheaded 3. VERTIGO: "Do you feel like either you or the room is spinning or tilting?" (i.e. vertigo) Spinning when in the bed - when up things do not spin 4. SEVERITY: "How bad is it?"  "Do you feel like you are going to faint?" "Can you stand and walk?"   - MILD: Feels slightly dizzy, but walking normally.   - MODERATE: Feels unsteady when walking, but not falling; interferes with normal activities (e.g., school, work).   - SEVERE: Unable to walk without falling, or requires assistance to walk without falling; feels like passing out now.      moderate 5. ONSET:  "When did the dizziness begin?"     2 weeks 6. AGGRAVATING FACTORS: "Does anything make it worse?" (e.g., standing, change in  head position)     N/a 7. HEART RATE: "Can you tell me your heart rate?" "How many beats in 15 seconds?"  (Note: not all patients can do this)       N/a 8. CAUSE: "What do you think is causing the dizziness?"     sinus 9. RECURRENT SYMPTOM: "Have you had dizziness before?" If Yes, ask: "When was the last time?" "What happened that time?"     N/a 10. OTHER SYMPTOMS: "Do you have any other symptoms?" (e.g., fever, chest pain, vomiting, diarrhea, bleeding)       nausea 11. PREGNANCY: "Is there any chance you are pregnant?" "When was your last menstrual period?"       Na/  Protocols used: Sinus Pain or  Congestion-A-AH, Dizziness - Lightheadedness-A-AH

## 2024-01-13 NOTE — Telephone Encounter (Signed)
 Pt agreed to virtual, appt scheduled

## 2024-01-14 ENCOUNTER — Other Ambulatory Visit: Payer: Self-pay | Admitting: Family Medicine

## 2024-01-15 ENCOUNTER — Other Ambulatory Visit: Payer: Self-pay | Admitting: Family Medicine

## 2024-01-17 ENCOUNTER — Encounter: Payer: Medicare HMO | Admitting: Family Medicine

## 2024-01-19 ENCOUNTER — Encounter: Admitting: Family Medicine

## 2024-01-24 ENCOUNTER — Encounter: Payer: Self-pay | Admitting: Family Medicine

## 2024-01-24 ENCOUNTER — Ambulatory Visit (INDEPENDENT_AMBULATORY_CARE_PROVIDER_SITE_OTHER): Admitting: Family Medicine

## 2024-01-24 VITALS — BP 121/69 | HR 59 | Temp 97.9°F | Ht 62.25 in | Wt 133.8 lb

## 2024-01-24 DIAGNOSIS — I10 Essential (primary) hypertension: Secondary | ICD-10-CM | POA: Diagnosis not present

## 2024-01-24 DIAGNOSIS — Z0000 Encounter for general adult medical examination without abnormal findings: Secondary | ICD-10-CM | POA: Diagnosis not present

## 2024-01-24 DIAGNOSIS — E7800 Pure hypercholesterolemia, unspecified: Secondary | ICD-10-CM | POA: Diagnosis not present

## 2024-01-24 DIAGNOSIS — Z1211 Encounter for screening for malignant neoplasm of colon: Secondary | ICD-10-CM | POA: Diagnosis not present

## 2024-01-24 MED ORDER — OXYBUTYNIN CHLORIDE ER 5 MG PO TB24: 5.0000 mg | ORAL_TABLET | Freq: Every day | ORAL | 3 refills | Status: AC

## 2024-01-24 MED ORDER — HYDROCHLOROTHIAZIDE 25 MG PO TABS: 12.5000 mg | ORAL_TABLET | Freq: Every day | ORAL | 3 refills | Status: AC

## 2024-01-24 MED ORDER — CITALOPRAM HYDROBROMIDE 20 MG PO TABS: ORAL_TABLET | ORAL | 3 refills | Status: AC

## 2024-01-24 MED ORDER — AMLODIPINE BESYLATE 5 MG PO TABS: 5.0000 mg | ORAL_TABLET | Freq: Every day | ORAL | 3 refills | Status: AC

## 2024-01-24 MED ORDER — LISINOPRIL 40 MG PO TABS: 20.0000 mg | ORAL_TABLET | Freq: Every day | ORAL | 3 refills | Status: AC

## 2024-01-24 NOTE — Progress Notes (Signed)
 Office Note 01/24/2024  CC:  Chief Complaint  Patient presents with   Annual Exam    Pt is fasting    HPI:  Patient is a 78 y.o. female who is here for annual health maintenance exam and f/u HTN and HLD.  Tracey Snow is feeling well. She is over her recent sinus infection.  No acute concerns.  Home blood pressures are consistently normal   Past Medical History:  Diagnosis Date   Anxiety and depression    Carpal tunnel syndrome, bilateral    Cervical spinal stenosis    Chronic sinusitis    noted on MR imaging for MS   Colon cancer screening    Cologuard NEG 05/07/21   Ganglion cyst    R wrist   History of basal cell carcinoma (BCC) of skin    multiple sites   History of herpes zoster    back   History of SCC (squamous cell carcinoma) of skin    multiple sites   Hypercholesterolemia    Hypertension    Multiple sclerosis (HCC)    Dx'd 1998 betaseron helped but caused skin necrosis in arms.  Has been in longterm remission OFF of DMT as of 2023.   Osteoarthritis, multiple sites    c spine and L thumb   Restless legs     Past Surgical History:  Procedure Laterality Date   25 GAUGE PARS PLANA VITRECTOMY WITH 20 GAUGE MVR PORT FOR MACULAR HOLE Left 04/06/2016   Procedure: 25 GAUGE PARS PLANA VITRECTOMY WITH 20 GAUGE MVR PORT FOR MACULAR HOLE;  Surgeon: Rexene Catching, MD;  Location: [facility] OR;  Service: Ophthalmology;  Laterality: Left;   CATARACT EXTRACTION Left    COLONOSCOPY     2011   GAS/FLUID EXCHANGE Left 04/06/2016   Procedure: GAS/FLUID EXCHANGE;  Surgeon: Rexene Catching, MD;  Location: [facility] OR;  Service: Ophthalmology;  Laterality: Left;   LASER PHOTO ABLATION Left 04/06/2016   Procedure: LASER PHOTO ABLATION;  Surgeon: Rexene Catching, MD;  Location: [facility] OR;  Service: Ophthalmology;  Laterality: Left;   MEMBRANE PEEL Left 04/06/2016   Procedure: MEMBRANE PEEL;  Surgeon: Rexene Catching, MD;  Location: [facility] OR;  Service: Ophthalmology;  Laterality: Left;   NASAL  SINUS SURGERY     PARS PLANA VITRECTOMY W/ REPAIR OF MACULAR HOLE Left 04/06/2016   RETINAL DETACHMENT SURGERY     SERUM PATCH Left 04/06/2016   Procedure: SERUM PATCH;  Surgeon: Rexene Catching, MD;  Location: [facility] OR;  Service: Ophthalmology;  Laterality: Left;   SKIN CANCER EXCISION Right 2024   TUBAL LIGATION      Family History  Problem Relation Age of Onset   Throat cancer Mother    Alcoholism Father    Heart disease Father    Stroke Father    Diabetes Sister    Diabetes Brother    Breast cancer Neg Hx     Social History   Socioeconomic History   Marital status: Divorced    Spouse name: Not on file   Number of children: 2   Years of education: Not on file   Highest education level: 12th grade  Occupational History   Not on file  Tobacco Use   Smoking status: Never   Smokeless tobacco: Never  Substance and Sexual Activity   Alcohol use: Yes    Comment: 04/06/2016 "might have 1 glass of wine/month, if that"   Drug use: No   Sexual activity: Never  Other Topics Concern  Not on file  Social History Narrative   Divorced, 2 children, 3 grandchildren.   She is from [place].   Educ: HS   Disability at one point d/t MS   No tob/alc   Social Drivers of Health   Financial Resource Strain: Medium Risk (01/20/2024)   Overall Financial Resource Strain (CARDIA)    Difficulty of Paying Living Expenses: Somewhat hard  Food Insecurity: No Food Insecurity (01/20/2024)   Hunger Vital Sign    Worried About Running Out of Food in the Last Year: Never true    Ran Out of Food in the Last Year: Never true  Transportation Needs: No Transportation Needs (01/20/2024)   PRAPARE - Administrator, Civil Service (Medical): No    Lack of Transportation (Non-Medical): No  Physical Activity: Insufficiently Active (01/20/2024)   Exercise Vital Sign    Days of Exercise per Week: 2 days    Minutes of Exercise per Session: 20 min  Stress: No Stress Concern Present (11/16/2023)    [employer] of Occupational Health - Occupational Stress Questionnaire    Feeling of Stress : Only a little  Social Connections: Moderately Integrated (01/20/2024)   Social Connection and Isolation Panel [NHANES]    Frequency of Communication with Friends and Family: Twice a week    Frequency of Social Gatherings with Friends and Family: Three times a week    Attends Religious Services: More than 4 times per year    Active Member of Clubs or Organizations: Yes    Attends Banker Meetings: More than 4 times per year    Marital Status: Separated  Recent Concern: Social Connections - Moderately Isolated (11/16/2023)   Social Connection and Isolation Panel [NHANES]    Frequency of Communication with Friends and Family: Three times a week    Frequency of Social Gatherings with Friends and Family: Once a week    Attends Religious Services: More than 4 times per year    Active Member of [employer] or Organizations: No    Attends Banker Meetings: Never    Marital Status: Separated  Intimate Partner Violence: Not At Risk (11/16/2023)   Humiliation, Afraid, Rape, and Kick questionnaire    Fear of Current or Ex-Partner: No    Emotionally Abused: No    Physically Abused: No    Sexually Abused: No    Outpatient Medications Prior to Visit  Medication Sig Dispense Refill   acetaminophen  (TYLENOL ) 325 MG tablet Take 650 mg by mouth every 6 (six) hours as needed for mild pain.     ALPRAZolam  (XANAX ) 0.5 MG tablet TAKE 1 TABLET BY MOUTH THREE TIMES A DAY 90 tablet 3   potassium chloride  SA (KLOR-CON  M) 20 MEQ tablet TAKE 1 TABLET BY MOUTH TWICE A DAY 180 tablet 0   rosuvastatin  (CRESTOR ) 20 MG tablet TAKE 1 TABLET BY MOUTH EVERY DAY 30 tablet 0   tiZANidine  (ZANAFLEX ) 4 MG capsule TAKE 1 CAPSULE BY MOUTH EVERY DAY 30 capsule 0   VITAMIN C, CALCIUM  ASCORBATE, PO Take by mouth daily.     amLODipine  (NORVASC ) 5 MG tablet TAKE 1 TABLET (5 MG TOTAL) BY MOUTH DAILY. 90 tablet 1    citalopram  (CELEXA ) 20 MG tablet TAKE 1 AND 1/2 TABLETS ONCE DAILY 135 tablet 0   hydrochlorothiazide  (HYDRODIURIL ) 25 MG tablet TAKE 1/2 TABLET BY MOUTH EVERY DAY 45 tablet 0   lisinopril  (ZESTRIL ) 40 MG tablet TAKE 1/2 TABLET BY MOUTH DAILY 45 tablet 0   oxybutynin  (  DITROPAN -XL) 5 MG 24 hr tablet TAKE 1 TABLET BY MOUTH EVERYDAY AT BEDTIME 90 tablet 0   Cholecalciferol (D3 VITAMIN PO) Take by mouth daily. (Patient not taking: Reported on 01/24/2024)     Cyanocobalamin (B-12 PO) Take by mouth daily. (Patient not taking: Reported on 01/24/2024)     No facility-administered medications prior to visit.    Allergies  Allergen Reactions   Sulfonamide Derivatives Rash and Other (See Comments)    DIFFICULTY SWALLOWING    Review of Systems  Constitutional:  Negative for appetite change, chills, fatigue and fever.  HENT:  Negative for congestion, dental problem, ear pain and sore throat.   Eyes:  Negative for discharge, redness and visual disturbance.  Respiratory:  Negative for cough, chest tightness, shortness of breath and wheezing.   Cardiovascular:  Negative for chest pain, palpitations and leg swelling.  Gastrointestinal:  Negative for abdominal pain, blood in stool, diarrhea, nausea and vomiting.  Genitourinary:  Negative for difficulty urinating, dysuria, flank pain, frequency, hematuria and urgency.  Musculoskeletal:  Negative for arthralgias, back pain, joint swelling, myalgias and neck stiffness.  Skin:  Negative for pallor and rash.  Neurological:  Negative for dizziness, speech difficulty, weakness and headaches.  Hematological:  Negative for adenopathy. Does not bruise/bleed easily.  Psychiatric/Behavioral:  Negative for confusion and sleep disturbance. The patient is not nervous/anxious.     PE;    01/24/2024    9:57 AM 01/13/2024    4:08 PM 09/20/2023    2:43 PM  Vitals with BMI  Height 5' 2.25"  5\' 2"   Weight 133 lbs 13 oz 130 lbs 134 lbs  BMI 24.28  24.5  Systolic 121  121   Diastolic 69  75  Pulse 59  64    Exam chaperoned by Terris Fickle, CMA. Gen: Alert, well appearing.  Patient is oriented to person, place, time, and situation. AFFECT: pleasant, lucid thought and speech. ENT: Ears: EACs clear, normal epithelium.  TMs with good light reflex and landmarks bilaterally.  Eyes: no injection, icteris, swelling, or exudate.  EOMI, PERRLA. Nose: no drainage or turbinate edema/swelling.  No injection or focal lesion.  Mouth: lips without lesion/swelling.  Oral mucosa pink and moist.  Dentition intact and without obvious caries or gingival swelling.  Oropharynx without erythema, exudate, or swelling.  Neck: supple/nontender.  No LAD, mass, or TM.  Carotid pulses 2+ bilaterally, without bruits. CV: RRR, no m/r/g.   LUNGS: CTA bilat, nonlabored resps, good aeration in all lung fields. ABD: soft, NT, ND, BS normal.  No hepatospenomegaly or mass.  No bruits. EXT: no clubbing, cyanosis, or edema.  Musculoskeletal: no joint swelling, erythema, warmth, or tenderness.  ROM of all joints intact. Skin - no sores or suspicious lesions or rashes or color changes  Pertinent labs:   Lab Results  Component Value Date   WBC 6.3 01/13/2023   HGB 12.8 01/13/2023   HCT 38.3 01/13/2023   MCV 93.0 01/13/2023   PLT 287 01/13/2023   Lab Results  Component Value Date   CREATININE 0.88 07/18/2023   BUN 12 07/18/2023   NA 140 07/18/2023   K 3.7 07/18/2023   CL 102 07/18/2023   CO2 31 07/18/2023   Lab Results  Component Value Date   ALT 19 07/18/2023   AST 19 07/18/2023   ALKPHOS 95 04/27/2021   BILITOT 0.5 07/18/2023   Lab Results  Component Value Date   CHOL 179 07/18/2023   Lab Results  Component Value Date  HDL 62 07/18/2023   Lab Results  Component Value Date   LDLCALC 91 07/18/2023   Lab Results  Component Value Date   TRIG 160 (H) 07/18/2023   Lab Results  Component Value Date   CHOLHDL 2.9 07/18/2023   ASSESSMENT AND PLAN:   #1 health  maintenance exam: Reviewed age and gender appropriate health maintenance issues (prudent diet, regular exercise, health risks of tobacco and excessive alcohol, use of seatbelts, fire alarms in home, use of sunscreen).  Also reviewed age and gender appropriate health screening as well as vaccine recommendations. Vaccines: Tdap-> she will get this at her pharmacy.  Shingrix -> she will get this at her pharmacy. Labs: CMET, CBC, lipids today. Cervical ca screening: Pap UTD 2023 by GYN MD-->Dr. Bonny Button.  No further screening needed. Breast ca screening: Mammogram at [facility] in [place] 03/2023 normal.  She has mammogram scheduled for this August. Colon ca screening: Cologuard negative 04/2021.  Rpt ordered today. She declines bone density screening.  She will continue vitamin D  and calcium  supplement.  2.  Hypertension, well-controlled on amlodipine  5 mg daily, HCTZ 12.5 mg daily, and lisinopril  20 mg daily. She takes 20 mill equivalents of potassium supplement twice a day. Check electrolytes and creatinine today.  3.  Hypercholesterolemia, doing well on Crestor  20 mg a day. Fasting lipid panel and hepatic panel today.    An After Visit Summary was printed and given to the patient.  FOLLOW UP:  Return in about 6 months (around 07/25/2024) for routine chronic illness f/u.  Signed:  Arletha Lady, MD           01/24/2024

## 2024-01-24 NOTE — Patient Instructions (Signed)

## 2024-01-25 ENCOUNTER — Ambulatory Visit: Payer: Self-pay | Admitting: Family Medicine

## 2024-01-25 LAB — COMPREHENSIVE METABOLIC PANEL WITH GFR
AG Ratio: 1.6 (calc) (ref 1.0–2.5)
ALT: 53 U/L — ABNORMAL HIGH (ref 6–29)
AST: 33 U/L (ref 10–35)
Albumin: 4.4 g/dL (ref 3.6–5.1)
Alkaline phosphatase (APISO): 76 U/L (ref 37–153)
BUN: 17 mg/dL (ref 7–25)
CO2: 29 mmol/L (ref 20–32)
Calcium: 9.7 mg/dL (ref 8.6–10.4)
Chloride: 104 mmol/L (ref 98–110)
Creat: 0.97 mg/dL (ref 0.60–1.00)
Globulin: 2.8 g/dL (ref 1.9–3.7)
Glucose, Bld: 80 mg/dL (ref 65–99)
Potassium: 4.6 mmol/L (ref 3.5–5.3)
Sodium: 140 mmol/L (ref 135–146)
Total Bilirubin: 0.8 mg/dL (ref 0.2–1.2)
Total Protein: 7.2 g/dL (ref 6.1–8.1)
eGFR: 61 mL/min/{1.73_m2} (ref >=60)

## 2024-01-25 LAB — LIPID PANEL
Cholesterol: 165 mg/dL (ref <200)
HDL: 69 mg/dL (ref >=50)
LDL Cholesterol (Calc): 80 mg/dL
Non-HDL Cholesterol (Calc): 96 mg/dL (ref <130)
Total CHOL/HDL Ratio: 2.4 (calc) (ref <5.0)
Triglycerides: 84 mg/dL (ref <150)

## 2024-01-25 LAB — CBC WITH DIFFERENTIAL/PLATELET
Absolute Lymphocytes: 2178 {cells}/uL (ref 850–3900)
Absolute Monocytes: 395 {cells}/uL (ref 200–950)
Basophils Absolute: 67 {cells}/uL (ref 0–200)
Basophils Relative: 1 %
Eosinophils Absolute: 201 {cells}/uL (ref 15–500)
Eosinophils Relative: 3 %
HCT: 41.4 % (ref 35.0–45.0)
Hemoglobin: 13.2 g/dL (ref 11.7–15.5)
MCH: 30.7 pg (ref 27.0–33.0)
MCHC: 31.9 g/dL — ABNORMAL LOW (ref 32.0–36.0)
MCV: 96.3 fL (ref 80.0–100.0)
MPV: 9.8 fL (ref 7.5–12.5)
Monocytes Relative: 5.9 %
Neutro Abs: 3859 {cells}/uL (ref 1500–7800)
Neutrophils Relative %: 57.6 %
Platelets: 312 10*3/uL (ref 140–400)
RBC: 4.3 10*6/uL (ref 3.80–5.10)
RDW: 13.6 % (ref 11.0–15.0)
Total Lymphocyte: 32.5 %
WBC: 6.7 10*3/uL (ref 3.8–10.8)

## 2024-01-31 ENCOUNTER — Telehealth: Payer: Self-pay

## 2024-01-31 MED ORDER — AMOXICILLIN-POT CLAVULANATE 875-125 MG PO TABS
1.0000 | ORAL_TABLET | Freq: Two times a day (BID) | ORAL | 0 refills | Status: AC
Start: 2024-01-31 — End: 2024-02-07

## 2024-01-31 NOTE — Addendum Note (Signed)
 Addended by: Terris Fickle D on: 01/31/2024 02:13 PM   Modules accepted: Orders

## 2024-01-31 NOTE — Telephone Encounter (Signed)
 Rx sent to pharmacy, patient advised of provider instructions.

## 2024-01-31 NOTE — Telephone Encounter (Signed)
 Okay to send in amoxicillin  875 mg, 1 twice daily x 7 days, #14. If not improved with this then I need to see her again in the office.

## 2024-01-31 NOTE — Telephone Encounter (Signed)
 Copied from CRM [id number]. Topic: General - Other >> Jan 31, 2024 10:56 AM Jenice Mitts wrote: Reason for CRM: Patient is having some popping and feels like her ear is inflammed. She thinks the infection moved to her ear and  She is wondering if another round of antibiotic can be sent in  Pt was seen on 6/10 for CPE. Please further advise

## 2024-02-06 ENCOUNTER — Ambulatory Visit: Payer: Self-pay

## 2024-02-06 DIAGNOSIS — J329 Chronic sinusitis, unspecified: Secondary | ICD-10-CM

## 2024-02-06 NOTE — Telephone Encounter (Signed)
 Please order ENT referral for recurrent sinusitis.

## 2024-02-06 NOTE — Addendum Note (Signed)
 Addended by: CLAUDENE SHANDA ORN on: 02/06/2024 03:32 PM   Modules accepted: Orders

## 2024-02-06 NOTE — Telephone Encounter (Signed)
 FYI Only or Action Required?: Action required by provider: referral request.  Patient was last seen in primary care on 01/24/2024 by McGowen, Aleene DEL, MD. Called Nurse Triage reporting Sinusitis. Symptoms began today. Interventions attempted: Prescription medications: Augmentin. Symptoms are: stable.  Triage Disposition: See Physician Within 24 Hours  Patient/caregiver understands and will follow disposition?: No, wishes to speak with PCPCopied from CRM [id number]. Topic: Clinical - Red Word Triage >> Feb 06, 2024 11:37 AM Franky GRADE wrote: Red Word that prompted transfer to Nurse Triage: Patient is calling because she is on her 2nd round of antibiotics for a sinus infection, she feels like symptoms are not improving and is starting to hear like a poipping sound in her left ear. Reason for Disposition  [1] Taking antibiotic > 72 hours (3 days) AND [2] sinus pain not improved  Answer Assessment - Initial Assessment Questions 1. ANTIBIOTIC: What antibiotic are you taking? How many times a day?     Augmentin  2. ONSET: When was the antibiotic started?     01/31/24 3. PAIN: How bad is the sinus pain?   (Scale 1-10; mild, moderate or severe)   - MILD (1-3): doesn't interfere with normal activities    - MODERATE (4-7): interferes with normal activities (e.g., work or school) or awakens from sleep   - SEVERE (8-10): excruciating pain and patient unable to do any normal activities        Denies pain 4. FEVER: Do you have a fever? If Yes, ask: What is it, how was it measured, and when did it start?      denies 5. SYMPTOMS: Are there any other symptoms you're concerned about? If Yes, ask: When did it start?     Dizzy, left ear popping   Pt been battling sinus infection for nearly a month. Pt is tired, loss of energy. Left ear stopped up. Ear is popping/cracking.Last dose of Augmentin taken today.  Pt doesn't want another appt with PCP at this time. Pt is asking for referral to ENT. PT gave  office info but hasn't heard back. Please advise.RN attempted CAL but no answer.  Protocols used: Sinus Infection on Antibiotic Follow-up Call-A-AH

## 2024-02-09 ENCOUNTER — Other Ambulatory Visit: Payer: Self-pay | Admitting: Neurology

## 2024-02-09 NOTE — Telephone Encounter (Signed)
 Last seen on 09/20/23 Follow up scheduled 04/05/24   Dispensed Days Supply Quantity Provider Pharmacy  ALPRAZOLAM  0.5 MG TABLET 01/01/2024 30 90 each Vear Charlie LABOR, MD [facility] [phone] - M...     Rx pending to be signed

## 2024-02-19 ENCOUNTER — Other Ambulatory Visit: Payer: Self-pay | Admitting: Family Medicine

## 2024-03-01 ENCOUNTER — Other Ambulatory Visit (INDEPENDENT_AMBULATORY_CARE_PROVIDER_SITE_OTHER)

## 2024-03-01 DIAGNOSIS — R7401 Elevation of levels of liver transaminase levels: Secondary | ICD-10-CM

## 2024-03-02 LAB — HEPATIC FUNCTION PANEL
ALT: 22 U/L (ref 0–35)
AST: 22 U/L (ref 0–37)
Albumin: 4.2 g/dL (ref 3.5–5.2)
Alkaline Phosphatase: 81 U/L (ref 39–117)
Bilirubin, Direct: 0.1 mg/dL (ref 0.0–0.3)
Total Bilirubin: 0.6 mg/dL (ref 0.2–1.2)
Total Protein: 7 g/dL (ref 6.0–8.3)

## 2024-03-05 ENCOUNTER — Ambulatory Visit: Payer: Self-pay | Admitting: Family Medicine

## 2024-03-09 ENCOUNTER — Other Ambulatory Visit: Payer: Self-pay | Admitting: Family Medicine

## 2024-03-14 ENCOUNTER — Other Ambulatory Visit: Payer: Self-pay | Admitting: Family Medicine

## 2024-03-21 ENCOUNTER — Ambulatory Visit
Admission: RE | Admit: 2024-03-21 | Discharge: 2024-03-21 | Disposition: A | Discharge status: Home / Self Care | Source: Ambulatory Visit | Attending: Family Medicine | Admitting: Family Medicine

## 2024-03-21 DIAGNOSIS — Z1231 Encounter for screening mammogram for malignant neoplasm of breast: Secondary | ICD-10-CM

## 2024-03-29 DIAGNOSIS — N390 Urinary tract infection, site not specified: Secondary | ICD-10-CM | POA: Diagnosis not present

## 2024-04-05 ENCOUNTER — Encounter: Payer: Self-pay | Admitting: Neurology

## 2024-04-05 ENCOUNTER — Ambulatory Visit: Payer: Medicare HMO | Admitting: Neurology

## 2024-04-05 VITALS — BP 126/74 | Ht 63.0 in | Wt 133.5 lb

## 2024-04-05 DIAGNOSIS — F418 Other specified anxiety disorders: Secondary | ICD-10-CM

## 2024-04-05 DIAGNOSIS — G35 Multiple sclerosis: Secondary | ICD-10-CM | POA: Diagnosis not present

## 2024-04-05 DIAGNOSIS — I6381 Other cerebral infarction due to occlusion or stenosis of small artery: Secondary | ICD-10-CM | POA: Diagnosis not present

## 2024-04-05 NOTE — Patient Instructions (Signed)
 Plan to recheck MRI brain next year  Continue exercise, active  Follow up 8 months

## 2024-04-05 NOTE — Progress Notes (Signed)
 Patient: Tracey Snow Date of Birth: 1946/02/22  Reason for Visit: Follow up History from: Patient Primary Neurologist: Sater   ASSESSMENT AND PLAN 78 y.o. year old female   1.  Multiple sclerosis -off DMT, remains stable, MRI of the brain in July 2023 showed stability from 2020, consider repeating MRI brain in 2026 -Continue exercise, activity   2.  Lacunar infarction -On aspirin 81 mg daily  -Strict management of vascular risk factors with a goal BP less than 130/90, A1c less than 7.0, LDL less than 70 for secondary stroke prevention  3.  Depression, anxiety -On Xanax  0.5 mg up to TID, Celexa  -Dr. Vear has been refilling her Xanax   Follow-up with me in 8 months or sooner if needed  HISTORY OF PRESENT ILLNESS: Today 04/05/24 04/05/24 SS: Prolonged sinus infection, seeing ENT next week. Balance is getting better, doing exercise 2 days a week. Vision is fine. Bottoms of feet stay numb, worse in hot weather. Wearing flip flops. Bladder infection, finished antibiotic, is on Ditropan . Remains on Xanax  0.5 mg, always takes at night for sleep, will take 1/2 tablet or not any for anxiety. Good energy level. Stays busy, likes to read, social activities, involved in church. Drives. Has 3 grandchildren.   09/20/23 Dr. Vear: She is a 78 y.o. woman who was diagnosed with MS in 1998.   She has been off all DMTs since 2017.   She has had no exacerbations since discontinuing.   However, she notes mild worsening in balance.   MRI 03/09/2022 showed no new lesions compared to 2020   Gait is doing well and she mostly keeps up with her peers.  On vacation in [place], she had trouble with all the walking.   She uses the bannister on stairs and would not feel safe on bleachers or stairs without rails.   She stopped water  aerobics  after a skin cancer Moh's surgery.   She has no falls.   Her right leg is a little weaker than her left.  She has mild dysesthesias in the hands   Bladder function is stable.    She had a UTI in 2022.  She has urgency and rare incontinence, helped by oxybutynin ..    Her vision is stable.   No MS related issues   She uses reading glasses.   She denies much fatigue,   However, if she exercises she is tired afterwards.   She has some anxiety and mild depression.  She noes mild cognitive issues wih reduced word finding.   She has some anxiety and is on Xanax  0.5 mg 3 pills most days  It also helps her sleep   BP was elevated today but ride in was hectic.   She reports checking BP regularly at home and it is fine.   02/03/23 SS: MOCA 20/30. Stopped water  aerobic while recovering from skin cancer removal to the right leg will take about 2 months to heal, it was deep. Will have to use chemo cream. Lives alone in [place], she drives, she manages her home. Has 2 grown children. Balance is a little off, can't wear a lot of heel. No falls. Her feet tingle, sometimes at night, stay a little numb. Fingers too if certain position. Thinks memory is good for her age. Struggles with Xanax  0.5 mg up to 3 times daily. Has always taken this way. She usually needs at least 1 tablet daily. Bladder is doing fine, several UTI. Vision is good, cataract surgery, wears reading  glasses. Overall feels memory is stable. Reports she didn't take aspirin 81 mg daily, she claims she couldn't find the dosage, was looking for the baby brand. Also on Celexa  from PCP.   HISTORY  08/03/22 Dr.Sater: Tracey Snow is a 78 year old woman who was diagnosed with MS in 1998.   She has been off all DMTs since 2017.   She has had no exacerbations since discontinuing.   MRI 03/09/2022 showed no new lesions compared to 2020   Neurologically, she is stable.  She has reduced balance and can't stand on a chair or use heels.   She does water  aerobics to try to stay active.  Gait is slightly worsening.   She uses the bannister on stairs.   She has no falls.   Her right leg is a little weaker than her left.     She denies much  fatigue,   However, if she exercises she is tired afterwards.   She has some anxiety but not depression.  She noes mild cognitive issues wih reduced word finding.   She has some anxiety and is on Xanax  0.5 mg 3 pills most days. On bad days she may take 4 and takes 2 on a good day.     She has mild dysesthesias in the hands   Bladder function is stable.   She had a UTI in 2022.  She takes a diuretic and sometimes has mild urgency.    Her vision is stable.   No MS related issues but she has had cataracts and had a retinal issue.   She uses reading glasses.   BP was elevated today but ride in was hectic.   She reports checking BP regularly at home and it is fine.     MS history: She was diagnosed with MS in 1998 after presenting with numbness form the waist down.  MRIs were consistent with MS.   She was referred to Dr. Juliane and Betaseron was started.     Her MS did ok but she did have a couple relapses, one with poor balance and slurred speech.   She received IV Solu-medrol for these.  In her 24's she was stable.   She stopped Betaseron in 2017 due to skin necrosis in both arms.    She has had no exacerbations though has noted mild progression.     IMAGING:   MRI brain 03/16/2022 showed T2/FLAIR hyperintense foci in the cerebral hemispheres, pons and left thalamus in a pattern consistent with chronic demyelinating plaque associated with multiple sclerosis, intermixed with chronic microvascular ischemic change.  None of the foci appear to be acute though there has been progression compared to the 2006 MRI. SABRA   No change compared to the MRI from 03/23/2019.   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   MRI of the brain 03/23/2019 shows multiple T2/FLAIR hyperintense foci in the periventricular, juxtacortical and deep white matter.  A couple small foci are noted in the pons and thalamus.  None of the foci appear to be acute.  They do not enhance.  According to the radiologist report, there is  no change compared to the previous MRI.  From 2017.  Additionally, the patient has chronic sinusitis.   MRI of the cervical spine 03/23/2019 shows degenerative changes C4-C5 through C6-C7 with spinal stenosis and various degrees of foraminal narrowing..  The spinal cord has normal signal.  The radiologist report states relatively unchanged compared to 12/09/2015.  REVIEW OF SYSTEMS: Out of a complete 14 system review of symptoms, the patient complains only of the following symptoms, and all other reviewed systems are negative.  See HPI  ALLERGIES: Allergies  Allergen Reactions   Sulfonamide Derivatives Rash and Other (See Comments)    DIFFICULTY SWALLOWING    HOME MEDICATIONS: Outpatient Medications Prior to Visit  Medication Sig Dispense Refill   acetaminophen  (TYLENOL ) 325 MG tablet Take 650 mg by mouth every 6 (six) hours as needed for mild pain.     ALPRAZolam  (XANAX ) 0.5 MG tablet TAKE 1 TABLET BY MOUTH THREE TIMES A DAY 90 tablet 3   amLODipine  (NORVASC ) 5 MG tablet Take 1 tablet (5 mg total) by mouth daily. 90 tablet 3   citalopram  (CELEXA ) 20 MG tablet TAKE 1 AND 1/2 TABLETS ONCE DAILY 135 tablet 3   hydrochlorothiazide  (HYDRODIURIL ) 25 MG tablet Take 0.5 tablets (12.5 mg total) by mouth daily. 45 tablet 3   lisinopril  (ZESTRIL ) 40 MG tablet Take 0.5 tablets (20 mg total) by mouth daily. 45 tablet 3   oxybutynin  (DITROPAN -XL) 5 MG 24 hr tablet Take 1 tablet (5 mg total) by mouth at bedtime. 90 tablet 3   potassium chloride  SA (KLOR-CON  M) 20 MEQ tablet TAKE 1 TABLET BY MOUTH TWICE A DAY 180 tablet 0   rosuvastatin  (CRESTOR ) 20 MG tablet TAKE 1 TABLET BY MOUTH EVERY DAY 90 tablet 0   tiZANidine  (ZANAFLEX ) 4 MG capsule TAKE 1 CAPSULE BY MOUTH EVERY DAY 30 capsule 1   VITAMIN C, CALCIUM  ASCORBATE, PO Take by mouth daily.     No facility-administered medications prior to visit.    PAST MEDICAL HISTORY: Past Medical History:  Diagnosis Date   Anxiety and depression    Carpal  tunnel syndrome, bilateral    Cervical spinal stenosis    Chronic sinusitis    noted on MR imaging for MS   Colon cancer screening    Cologuard NEG 05/07/21   Ganglion cyst    R wrist   History of basal cell carcinoma (BCC) of skin    multiple sites   History of herpes zoster    back   History of SCC (squamous cell carcinoma) of skin    multiple sites   Hypercholesterolemia    Hypertension    Multiple sclerosis (HCC)    Dx'd 1998 betaseron helped but caused skin necrosis in arms.  Has been in longterm remission OFF of DMT as of 2023.   Osteoarthritis, multiple sites    c spine and L thumb   Restless legs     PAST SURGICAL HISTORY: Past Surgical History:  Procedure Laterality Date   25 GAUGE PARS PLANA VITRECTOMY WITH 20 GAUGE MVR PORT FOR MACULAR HOLE Left 04/06/2016   Procedure: 25 GAUGE PARS PLANA VITRECTOMY WITH 20 GAUGE MVR PORT FOR MACULAR HOLE;  Surgeon: Norleen JONETTA Ku, MD;  Location: [facility] OR;  Service: Ophthalmology;  Laterality: Left;   CATARACT EXTRACTION Left    COLONOSCOPY     2011   GAS/FLUID EXCHANGE Left 04/06/2016   Procedure: GAS/FLUID EXCHANGE;  Surgeon: Norleen JONETTA Ku, MD;  Location: [facility] OR;  Service: Ophthalmology;  Laterality: Left;   LASER PHOTO ABLATION Left 04/06/2016   Procedure: LASER PHOTO ABLATION;  Surgeon: Norleen JONETTA Ku, MD;  Location: [facility] OR;  Service: Ophthalmology;  Laterality: Left;   MEMBRANE PEEL Left 04/06/2016   Procedure: MEMBRANE PEEL;  Surgeon: Norleen JONETTA Ku, MD;  Location: [facility] OR;  Service: Ophthalmology;  Laterality: Left;  NASAL SINUS SURGERY     PARS PLANA VITRECTOMY W/ REPAIR OF MACULAR HOLE Left 04/06/2016   RETINAL DETACHMENT SURGERY     SERUM PATCH Left 04/06/2016   Procedure: SERUM PATCH;  Surgeon: Norleen JONETTA Ku, MD;  Location: [facility] OR;  Service: Ophthalmology;  Laterality: Left;   SKIN CANCER EXCISION Right 2024   TUBAL LIGATION      FAMILY HISTORY: Family History  Problem Relation Age of Onset   Throat cancer Mother     Alcoholism Father    Heart disease Father    Stroke Father    Diabetes Sister    Diabetes Brother    Breast cancer Neg Hx    BRCA 1/2 Neg Hx     SOCIAL HISTORY: Social History   Socioeconomic History   Marital status: Divorced    Spouse name: Not on file   Number of children: 2   Years of education: Not on file   Highest education level: 12th grade  Occupational History   Not on file  Tobacco Use   Smoking status: Never   Smokeless tobacco: Never  Substance and Sexual Activity   Alcohol use: Yes    Comment: 04/06/2016 might have 1 glass of wine/month, if that   Drug use: No   Sexual activity: Never  Other Topics Concern   Not on file  Social History Narrative   Divorced, 2 children, 3 grandchildren.   She is from [place].   Educ: HS   Disability at one point d/t MS   No tob/alc   Right handed   Caffeine- 1 cup daily   Social Drivers of Health   Financial Resource Strain: Medium Risk (01/20/2024)   Overall Financial Resource Strain (CARDIA)    Difficulty of Paying Living Expenses: Somewhat hard  Food Insecurity: No Food Insecurity (03/29/2024)   [facility] from [facility]   Hunger Vital Sign    Within the past 12 months, you worried that your food would run out before you got the money to buy more.: Never true    Within the past 12 months, the food you bought just didn't last and you didn't have money to get more.: Never true  Transportation Needs: No Transportation Needs (03/29/2024)   Received from [facility]   PRAPARE - Transportation    Lack of Transportation (Medical): No    Lack of Transportation (Non-Medical): No  Physical Activity: Insufficiently Active (01/20/2024)   Exercise Vital Sign    Days of Exercise per Week: 2 days    Minutes of Exercise per Session: 20 min  Stress: No Stress Concern Present (11/16/2023)   [employer] of Occupational Health - Occupational Stress Questionnaire    Feeling of Stress : Only a little  Social  Connections: Moderately Integrated (01/20/2024)   Social Connection and Isolation Panel    Frequency of Communication with Friends and Family: Twice a week    Frequency of Social Gatherings with Friends and Family: Three times a week    Attends Religious Services: More than 4 times per year    Active Member of Clubs or Organizations: Yes    Attends Banker Meetings: More than 4 times per year    Marital Status: Separated  Recent Concern: Social Connections - Moderately Isolated (11/16/2023)   Social Connection and Isolation Panel    Frequency of Communication with Friends and Family: Three times a week    Frequency of Social Gatherings with Friends and Family: Once a week  Attends Religious Services: More than 4 times per year    Active Member of Clubs or Organizations: No    Attends Banker Meetings: Never    Marital Status: Separated  Intimate Partner Violence: Not At Risk (11/16/2023)   Humiliation, Afraid, Rape, and Kick questionnaire    Fear of Current or Ex-Partner: No    Emotionally Abused: No    Physically Abused: No    Sexually Abused: No   PHYSICAL EXAM  Vitals:   04/05/24 1335  BP: 126/74  Weight: 133 lb 8 oz (60.6 kg)  Height: 5' 3 (1.6 m)    Body mass index is 23.65 kg/m.    04/05/2024    1:39 PM 02/03/2023   12:40 PM  Montreal Cognitive Assessment   Visuospatial/ Executive (0/5) 3 0  Naming (0/3) 3 3  Attention: Read list of digits (0/2) 1 2  Attention: Read list of letters (0/1) 0 1  Attention: Serial 7 subtraction starting at 100 (0/3) 0 1  Language: Repeat phrase (0/2) 0 1  Language : Fluency (0/1) 0 0  Abstraction (0/2) 1 2  Delayed Recall (0/5) 4 4  Orientation (0/6) 6 6  Total 18 20  Adjusted Score (based on education)  20   Generalized: Well developed, in no acute distress  Neurological examination  Mentation: Alert oriented to time, place, history taking. Follows all commands speech and language fluent. No word finding  trouble noted.  Cranial nerve II-XII: Pupils were equal round reactive to light. Extraocular movements were full, visual field were full on confrontational test. Facial sensation and strength were normal. Head turning and shoulder shrug  were normal and symmetric. Motor: The motor testing reveals 5 over 5 strength of all 4 extremities. Good symmetric motor tone is noted throughout.  Sensory: Sensory testing is intact to soft touch on all 4 extremities. No evidence of extinction is noted.  Coordination: Cerebellar testing reveals good finger-nose-finger and heel-to-shin bilaterally.  Gait and station: Gait is normal, wearing flip flops  Reflexes: Deep tendon reflexes are symmetric and normal bilaterally.   DIAGNOSTIC DATA (LABS, IMAGING, TESTING) - I reviewed patient records, labs, notes, testing and imaging myself where available.  Lab Results  Component Value Date   WBC 6.7 01/24/2024   HGB 13.2 01/24/2024   HCT 41.4 01/24/2024   MCV 96.3 01/24/2024   PLT 312 01/24/2024      Component Value Date/Time   NA 140 01/24/2024 1022   NA 145 04/27/2021 0000   K 4.6 01/24/2024 1022   CL 104 01/24/2024 1022   CO2 29 01/24/2024 1022   GLUCOSE 80 01/24/2024 1022   BUN 17 01/24/2024 1022   BUN 11 04/27/2021 0000   CREATININE 0.97 01/24/2024 1022   CALCIUM  9.7 01/24/2024 1022   PROT 7.0 03/01/2024 1127   ALBUMIN 4.2 03/01/2024 1127   AST 22 03/01/2024 1127   ALT 22 03/01/2024 1127   ALKPHOS 81 03/01/2024 1127   BILITOT 0.6 03/01/2024 1127   GFRNONAA >60 04/06/2016 1017   GFRAA >60 04/06/2016 1017   Lab Results  Component Value Date   CHOL 165 01/24/2024   HDL 69 01/24/2024   LDLCALC 80 01/24/2024   TRIG 84 01/24/2024   CHOLHDL 2.4 01/24/2024   No results found for: HGBA1C No results found for: VITAMINB12 No results found for: TSH  Lauraine Born, AGNP-C, DNP 04/05/2024, 2:00 PM [facility] [address], [address] [place], [place] [address] [phone]

## 2024-04-12 ENCOUNTER — Encounter (INDEPENDENT_AMBULATORY_CARE_PROVIDER_SITE_OTHER): Payer: Self-pay | Admitting: Otolaryngology

## 2024-04-12 ENCOUNTER — Ambulatory Visit (INDEPENDENT_AMBULATORY_CARE_PROVIDER_SITE_OTHER): Admitting: Otolaryngology

## 2024-04-12 VITALS — BP 122/66 | HR 73

## 2024-04-12 DIAGNOSIS — J343 Hypertrophy of nasal turbinates: Secondary | ICD-10-CM

## 2024-04-12 DIAGNOSIS — J3089 Other allergic rhinitis: Secondary | ICD-10-CM | POA: Diagnosis not present

## 2024-04-12 DIAGNOSIS — H6990 Unspecified Eustachian tube disorder, unspecified ear: Secondary | ICD-10-CM | POA: Diagnosis not present

## 2024-04-12 DIAGNOSIS — J329 Chronic sinusitis, unspecified: Secondary | ICD-10-CM

## 2024-04-12 DIAGNOSIS — R0981 Nasal congestion: Secondary | ICD-10-CM

## 2024-04-12 DIAGNOSIS — R0982 Postnasal drip: Secondary | ICD-10-CM

## 2024-04-12 MED ORDER — LEVOCETIRIZINE DIHYDROCHLORIDE 5 MG PO TABS: 5.0000 mg | ORAL_TABLET | Freq: Every evening | ORAL | 3 refills | Status: AC

## 2024-04-12 MED ORDER — FLUTICASONE PROPIONATE 50 MCG/ACT NA SUSP: 2.0000 | Freq: Every day | NASAL | 6 refills | Status: AC

## 2024-04-12 NOTE — Progress Notes (Signed)
 ENT CONSULT:  Reason for Consult: chronic nasal congestion    HPI: Discussed the use of AI scribe software for clinical note transcription with the patient, who gave verbal consent to proceed.  History of Present Illness Tracey Snow is a 78 year old female with recurrent sinus problems and allergies who presents with persistent symptoms of sinusitis.  She has experienced a recurrence of sinusitis symptoms since the end of May. She was prescribed antibiotics twice, which she completed.  She has a longstanding history of sinus problems and allergies, having been tested years ago and undergoing surgery for a deviated septum in the 1980s. She currently uses Zyrtec and Flonase  for her allergies, though she is uncertain if Zyrtec is still effective.  Her symptoms include nasal drainage, described as 'yellowish' when she blows her nose, and a sensation of voice changes possibly due to drainage. She frequently uses nasal saline rinses and has tried a neti pot to alleviate symptoms.  She has previously undergone allergy testing and treatment, including allergy shots, and recalls being allergic to 'everything green.'   Records Reviewed:  Lauraine Born NP 04/05/24 1.  Multiple sclerosis -off DMT, remains stable, MRI of the brain in July 2023 showed stability from 2020, consider repeating MRI brain in 2026 -Continue exercise, activity    2.  Lacunar infarction -On aspirin 81 mg daily  -Strict management of vascular risk factors with a goal BP less than 130/90, A1c less than 7.0, LDL less than 70 for secondary stroke prevention   3.  Depression, anxiety -On Xanax  0.5 mg up to TID, Celexa  -Dr. Vear has been refilling her Xanax    Follow-up with me in 8 months or sooner if needed    Past Medical History:  Diagnosis Date   Anxiety and depression    Carpal tunnel syndrome, bilateral    Cervical spinal stenosis    Chronic sinusitis    noted on MR imaging for MS   Colon cancer screening     Cologuard NEG 05/07/21   Ganglion cyst    R wrist   History of basal cell carcinoma (BCC) of skin    multiple sites   History of herpes zoster    back   History of SCC (squamous cell carcinoma) of skin    multiple sites   Hypercholesterolemia    Hypertension    Multiple sclerosis (HCC)    Dx'd 1998 betaseron helped but caused skin necrosis in arms.  Has been in longterm remission OFF of DMT as of 2023.   Osteoarthritis, multiple sites    c spine and L thumb   Restless legs     Past Surgical History:  Procedure Laterality Date   25 GAUGE PARS PLANA VITRECTOMY WITH 20 GAUGE MVR PORT FOR MACULAR HOLE Left 04/06/2016   Procedure: 25 GAUGE PARS PLANA VITRECTOMY WITH 20 GAUGE MVR PORT FOR MACULAR HOLE;  Surgeon: Norleen JONETTA Ku, MD;  Location: [facility] OR;  Service: Ophthalmology;  Laterality: Left;   CATARACT EXTRACTION Left    COLONOSCOPY     2011   GAS/FLUID EXCHANGE Left 04/06/2016   Procedure: GAS/FLUID EXCHANGE;  Surgeon: Norleen JONETTA Ku, MD;  Location: [facility] OR;  Service: Ophthalmology;  Laterality: Left;   LASER PHOTO ABLATION Left 04/06/2016   Procedure: LASER PHOTO ABLATION;  Surgeon: Norleen JONETTA Ku, MD;  Location: [facility] OR;  Service: Ophthalmology;  Laterality: Left;   MEMBRANE PEEL Left 04/06/2016   Procedure: MEMBRANE PEEL;  Surgeon: Norleen JONETTA Ku, MD;  Location: [facility] OR;  Service: Ophthalmology;  Laterality: Left;   NASAL SINUS SURGERY     PARS PLANA VITRECTOMY W/ REPAIR OF MACULAR HOLE Left 04/06/2016   RETINAL DETACHMENT SURGERY     SERUM PATCH Left 04/06/2016   Procedure: SERUM PATCH;  Surgeon: Norleen JONETTA Ku, MD;  Location: [facility] OR;  Service: Ophthalmology;  Laterality: Left;   SKIN CANCER EXCISION Right 2024   TUBAL LIGATION      Family History  Problem Relation Age of Onset   Throat cancer Mother    Alcoholism Father    Heart disease Father    Stroke Father    Diabetes Sister    Diabetes Brother    Breast cancer Neg Hx    BRCA 1/2 Neg Hx     Social History:   reports that she has never smoked. She has never used smokeless tobacco. She reports current alcohol use. She reports that she does not use drugs.  Allergies:  Allergies  Allergen Reactions   Sulfonamide Derivatives Rash and Other (See Comments)    DIFFICULTY SWALLOWING    Medications: I have reviewed the patient's current medications.  The PMH, PSH, Medications, Allergies, and SH were reviewed and updated.  ROS: Constitutional: Negative for fever, weight loss and weight gain. Cardiovascular: Negative for chest pain and dyspnea on exertion. Respiratory: Is not experiencing shortness of breath at rest. Gastrointestinal: Negative for nausea and vomiting. Neurological: Negative for headaches. Psychiatric: The patient is not nervous/anxious  Blood pressure 122/66, pulse 73, SpO2 98%. There is no height or weight on file to calculate BMI.  PHYSICAL EXAM:  Exam: General: Well-developed, well-nourished Respiratory Respiratory effort: Equal inspiration and expiration without stridor Cardiovascular Peripheral Vascular: Warm extremities with equal color/perfusion Eyes: No nystagmus with equal extraocular motion bilaterally Neuro/Psych/Balance: Patient oriented to person, place, and time; Appropriate mood and affect; Gait is intact with no imbalance; Cranial nerves I-XII are intact Head and Face Inspection: Normocephalic and atraumatic without mass or lesion Palpation: Facial skeleton intact without bony stepoffs Salivary Glands: No mass or tenderness Facial Strength: Facial motility symmetric and full bilaterally ENT Pinna: External ear intact and fully developed External canal: Canal is patent with intact skin Tympanic Membrane: Clear and mobile External Nose: No scar or anatomic deformity Internal Nose: Septum is relatively straight. No polyp, or purulence. Mucosal edema and erythema present.  Bilateral inferior turbinate hypertrophy.  Lips, Teeth, and gums: Mucosa and teeth  intact and viable TMJ: No pain to palpation with full mobility Oral cavity/oropharynx: No erythema or exudate, no lesions present Nasopharynx: No mass or lesion with intact mucosa Neck Neck and Trachea: Midline trachea without mass or lesion Thyroid: No mass or nodularity Lymphatics: No lymphadenopathy  Procedure:   PROCEDURE NOTE: nasal endoscopy  Preoperative diagnosis: chronic sinusitis symptoms  Postoperative diagnosis: same  Procedure: Diagnostic nasal endoscopy (68768)  Surgeon: Elena Larry, M.D.  Anesthesia: Topical lidocaine  and Afrin  H&P REVIEW: The patient's history and physical were reviewed today prior to procedure. All medications were reviewed and updated as well. Complications: None Condition is stable throughout exam Indications and consent: The patient presents with symptoms of chronic sinusitis not responding to previous therapies. All the risks, benefits, and potential complications were reviewed with the patient preoperatively and informed consent was obtained. The time out was completed with confirmation of the correct procedure.   Procedure: The patient was seated upright in the clinic. Topical lidocaine  and Afrin were applied to the nasal cavity. After adequate anesthesia had occurred, the rigid nasal endoscope was passed  into the nasal cavity. The nasal mucosa, turbinates, septum, and sinus drainage pathways were visualized bilaterally. This revealed no purulence or significant secretions that might be cultured. There were no polyps or sites of significant inflammation. The mucosa was intact and there was no crusting present. The scope was then slowly withdrawn and the patient tolerated the procedure well. There were no complications or blood loss.    Studies Reviewed: MRI brain 03/09/22  IMPRESSION: This MRI of the brain without contrast shows the following: T2/FLAIR hyperintense foci in the cerebral hemispheres, pons and left thalamus in a pattern  consistent with chronic demyelinating plaque associated with multiple sclerosis, intermixed with chronic microvascular ischemic change.  None of the foci appear to be acute though there has been progression compared to the 2006 MRI.  If the MRI from 2020 becomes available an addendum will be dictated. 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 No acute findings.  Assessment/Plan: Encounter Diagnoses  Name Primary?   Chronic sinusitis, unspecified location Yes   Environmental and seasonal allergies    Chronic nasal congestion    Hypertrophy of both inferior nasal turbinates    Post-nasal drip     Assessment and Plan Assessment & Plan Chronic nasal congestion with post-nasal drainage and environmental allergies Several episodes of sinusitis treated with abx. MRI brain 2023 with clear sinuses, no imaging since then.  Differential includes chronic sinus disease versus nasal passage issues/chronic nasal congestion. - Prescribed Xyzal  5 mg at night instead of Zyrtec. - Prescribed Flonase  twice daily. - Ordered CT scan of sinuses. - Consider allergy retesting and potential allergy shots if indicated. - Encouraged continued use of nasal saline rinses.  Eustachian tube dysfunction Reports ear popping. Eustachian tube dysfunction likely due to nasal congestion, with ear popping.  - Provided information on eustachian tube dysfunction management. - Continue current treatment for nasal congestion.   RTC 3 mo after CT sinuses   Thank you for allowing me to participate in the care of this patient. Please do not hesitate to contact me with any questions or concerns.   Elena Larry, MD Otolaryngology [facility] Phone: [phone] Fax: [phone]    04/12/2024, 9:36 AM

## 2024-04-12 NOTE — Patient Instructions (Addendum)
 Tracey Snow Med Nasal Saline Rinse   - start nasal saline rinses with NeilMed Bottle available over the counter or online to help with nasal congestion     See information about Eustachian Tube Dysfunction below:    Overview The eustachian (say "you-STAY-shee-un") tubes connect the middle ear on each side to the back of the throat. They keep air pressure stable in the ears. If your eustachian tubes become blocked, the air pressure in your ears changes. A quick change in air pressure can cause eustachian tubes to close up. This might happen when an airplane changes altitude or when a scuba diver goes up or down underwater. And a cold can make the tubes swell and block the fluid in the middle ear from draining out. That can cause pain.  Eustachian tube problems often clear up on their own or after treating the cause of the blockage. If your tubes continue to be blocked, you may need surgery.  Follow-up care is a key part of your treatment and safety. Be sure to make and go to all appointments, and call your doctor or nurse advice line (811 in most provinces and territories) if you are having problems. It's also a good idea to know your test results and keep a list of the medicines you take.  How can you care for yourself at home? Try a simple exercise to help open blocked tubes. Close your mouth, hold your nose, and gently blow as if you are blowing your nose. Yawning and chewing gum also may help. You may hear or feel a "pop" when the tubes open. To ease ear pain, apply a warm face cloth or a heating pad set on low. There may be some drainage from the ear when the heat melts earwax. Put a cloth between the heat source and your skin. If your doctor prescribed antibiotics, take them as directed. Do not stop taking them just because you feel better. You need to take the full course of antibiotics. Be safe with medicines. Depending on the cause of the problem, your doctor may recommend over-the-counter  medicine. For example, adults may try decongestants for cold symptoms or nasal spray steroids for allergies. Follow the instructions carefully.

## 2024-05-09 ENCOUNTER — Other Ambulatory Visit: Payer: Self-pay | Admitting: Family Medicine

## 2024-05-14 DIAGNOSIS — Z1211 Encounter for screening for malignant neoplasm of colon: Secondary | ICD-10-CM | POA: Diagnosis not present

## 2024-05-16 DIAGNOSIS — L578 Other skin changes due to chronic exposure to nonionizing radiation: Secondary | ICD-10-CM | POA: Diagnosis not present

## 2024-05-16 DIAGNOSIS — D1801 Hemangioma of skin and subcutaneous tissue: Secondary | ICD-10-CM | POA: Diagnosis not present

## 2024-05-16 DIAGNOSIS — L814 Other melanin hyperpigmentation: Secondary | ICD-10-CM | POA: Diagnosis not present

## 2024-05-16 DIAGNOSIS — Z85828 Personal history of other malignant neoplasm of skin: Secondary | ICD-10-CM | POA: Diagnosis not present

## 2024-05-16 DIAGNOSIS — D485 Neoplasm of uncertain behavior of skin: Secondary | ICD-10-CM | POA: Diagnosis not present

## 2024-05-16 DIAGNOSIS — L570 Actinic keratosis: Secondary | ICD-10-CM | POA: Diagnosis not present

## 2024-05-16 DIAGNOSIS — L821 Other seborrheic keratosis: Secondary | ICD-10-CM | POA: Diagnosis not present

## 2024-05-16 DIAGNOSIS — C44729 Squamous cell carcinoma of skin of left lower limb, including hip: Secondary | ICD-10-CM | POA: Diagnosis not present

## 2024-05-16 DIAGNOSIS — Z08 Encounter for follow-up examination after completed treatment for malignant neoplasm: Secondary | ICD-10-CM | POA: Diagnosis not present

## 2024-05-25 LAB — COLOGUARD: COLOGUARD: NEGATIVE

## 2024-05-27 ENCOUNTER — Encounter: Payer: Self-pay | Admitting: Family Medicine

## 2024-05-28 ENCOUNTER — Telehealth (INDEPENDENT_AMBULATORY_CARE_PROVIDER_SITE_OTHER): Payer: Self-pay

## 2024-05-28 NOTE — Telephone Encounter (Signed)
 Attempted to call patient back regarding VM.

## 2024-05-29 ENCOUNTER — Telehealth (INDEPENDENT_AMBULATORY_CARE_PROVIDER_SITE_OTHER): Payer: Self-pay | Admitting: Otolaryngology

## 2024-05-29 NOTE — Telephone Encounter (Signed)
 The patient is having the imaging done tomorrow.  She wants Dr Soldatova to know that she has developed new symptoms.  She reports that she has pain in the back of her mouth in more than one tooth, her jaw line is swollen and her face hurts from the jaw up the side of her face to her eye area.  She is also not feeling well,  She will have the scan done so that Dr Okey can review the results, she is also concerned and wants to know if she needs to be seen in the office soon for these symptoms as well?

## 2024-05-30 ENCOUNTER — Ambulatory Visit (HOSPITAL_COMMUNITY)
Admission: RE | Admit: 2024-05-30 | Discharge: 2024-05-30 | Disposition: A | Discharge status: Home / Self Care | Source: Ambulatory Visit | Attending: Otolaryngology | Admitting: Otolaryngology

## 2024-05-30 DIAGNOSIS — M1909 Primary osteoarthritis, other specified site: Secondary | ICD-10-CM | POA: Diagnosis not present

## 2024-05-30 DIAGNOSIS — J329 Chronic sinusitis, unspecified: Secondary | ICD-10-CM | POA: Diagnosis not present

## 2024-05-30 DIAGNOSIS — J320 Chronic maxillary sinusitis: Secondary | ICD-10-CM | POA: Diagnosis not present

## 2024-05-30 DIAGNOSIS — M26602 Left temporomandibular joint disorder, unspecified: Secondary | ICD-10-CM | POA: Diagnosis not present

## 2024-05-30 DIAGNOSIS — J322 Chronic ethmoidal sinusitis: Secondary | ICD-10-CM | POA: Diagnosis not present

## 2024-05-31 NOTE — Telephone Encounter (Signed)
 Called patient back with an answer to her question. Patient explains that she is having TMJ issues but feels that her throat is sore. I explained that we do not treat TMJ but we can treat her for the sore throat. Patient explains that she is going to wait for us  to call back regarding her face CT. Patient also explains that she may have a sinus infection. Please advise.

## 2024-06-04 ENCOUNTER — Telehealth (INDEPENDENT_AMBULATORY_CARE_PROVIDER_SITE_OTHER): Payer: Self-pay

## 2024-06-04 ENCOUNTER — Telehealth: Payer: Self-pay

## 2024-06-04 ENCOUNTER — Ambulatory Visit: Payer: Self-pay

## 2024-06-04 NOTE — Telephone Encounter (Signed)
 Copied from CRM #[id number]. Topic: Clinical - Lab/Test Results >> Jun 04, 2024 10:55 AM Tracey Snow wrote: Reason for CRM: patient calling because the ENT she was seeing isn't there anymore and needs Dr Candise to look at the recent CT scan and call her back. She still having issues and doesn't know what else to do. She is requesting a call back something going on with her jaw and her neck.

## 2024-06-04 NOTE — Telephone Encounter (Signed)
 FYI Only or Action Required?: FYI only for provider.  Patient was last seen in primary care on 01/24/2024 by McGowen, Aleene DEL, MD.  Called Nurse Triage reporting Facial Swelling.  Symptoms began several weeks ago.  Symptoms are: unchanged.  Triage Disposition: See PCP When Office is Open (Within 3 Days)  Patient/caregiver understands and will follow disposition?: Yes     Copied from CRM #[id number]. Topic: Clinical - Red Word Triage >> Jun 04, 2024 11:30 AM Corin V wrote: [facility] that prompted transfer to Nurse Triage: Patient is having swelling in her jaw. She is having pain in her jaw and neck and into her shoulder.      Reason for Disposition  [1] MILD face swelling (e.g., puffiness) AND [2] persists > 3 days  Answer Assessment - Initial Assessment Questions 1. ONSET: When did the swelling start? (e.g., minutes, hours, days)     Several weeks ago   2. LOCATION: What part of the face is swollen? (e.g., cheek, entire face, jaw joint area, under jaw)     Left sided face/jaw 3. SEVERITY: How swollen is it?     Mild I can tell it;s a bit off 4. ITCHING: Is there any itching? If Yes, ask: How much?   (Scale 1-10; mild, moderate or severe)     No 5. PAIN: Is the swelling painful to touch? If Yes, ask: How painful is it?   (Scale 0-10; mild, moderate or severe)     Mild intermittent pain  6. FEVER: Do you have a fever? If Yes, ask: What is it, how was it measured, and when did it start?      No 7. CAUSE: What do you think is causing the face swelling?     Unsure  8. NEW MEDICINES: Have there been any new medicines started recently?     No 9. RECURRENT SYMPTOM: Have you had face swelling before? If Yes, ask: When was the last time? What happened that time?     No 10. OTHER SYMPTOMS: Do you have any other symptoms? (e.g., leg swelling, toothache)       Fatigue, sinus congestion, mild swelling to neck and shoulder  Protocols used: Face  Swelling-A-AH

## 2024-06-04 NOTE — Telephone Encounter (Signed)
 Was a Pt of Soldatova. Pt called she wanted her CT read Masciello read it let her know there was no infection so that was good. Pt is demanding to be seen as soon as possible to figure out what is going on because she is not feeling any better.

## 2024-06-04 NOTE — Telephone Encounter (Signed)
 LVM for patient to call back to schedule appointment with Dr. Masciello

## 2024-06-06 ENCOUNTER — Ambulatory Visit (INDEPENDENT_AMBULATORY_CARE_PROVIDER_SITE_OTHER): Admitting: Family Medicine

## 2024-06-06 ENCOUNTER — Encounter: Payer: Self-pay | Admitting: Family Medicine

## 2024-06-06 ENCOUNTER — Ambulatory Visit: Payer: Self-pay

## 2024-06-06 VITALS — BP 125/78 | HR 70 | Temp 98.0°F | Wt 134.6 lb

## 2024-06-06 DIAGNOSIS — M26629 Arthralgia of temporomandibular joint, unspecified side: Secondary | ICD-10-CM

## 2024-06-06 DIAGNOSIS — K1120 Sialoadenitis, unspecified: Secondary | ICD-10-CM

## 2024-06-06 MED ORDER — NAPROXEN 250 MG PO TABS: 250.0000 mg | ORAL_TABLET | Freq: Two times a day (BID) | ORAL | 0 refills | Status: AC

## 2024-06-06 MED ORDER — METHYLPREDNISOLONE ACETATE 80 MG/ML IJ SUSP
80.0000 mg | Freq: Once | INTRAMUSCULAR | Status: AC
  Administered 2024-06-06: 80 mg via INTRAMUSCULAR

## 2024-06-06 MED ORDER — AMOXICILLIN-POT CLAVULANATE 875-125 MG PO TABS: 1.0000 | ORAL_TABLET | Freq: Two times a day (BID) | ORAL | 0 refills | Status: DC

## 2024-06-06 NOTE — Telephone Encounter (Signed)
 FYI Only or Action Required?: Action required by provider: clinical question for provider and need new rx due to allergy.  Patient was last seen in primary care on 06/06/2024 by Catherine Fuller A, DO.  Called Nurse Triage reporting Medication Problem.  Symptoms began today.  Interventions attempted: Other: consulted pharmacist.  Symptoms are: no symptoms related to medication at this time.  Triage Disposition: Call PCP Now  Patient/caregiver understands and will follow disposition?: No, wishes to speak with PCP    Copied from CRM #[id number]. Topic: Clinical - Prescription Issue >> Jun 06, 2024  1:07 PM Rosina BIRCH wrote: Reason for CRM: patient called stating she saw [facility] today and she was prescribed an antiinflammatory and it was flagged by the pharmacy because sulfate is in the drug and she is allergic to it and she want to know if the shot that was given today had sulfate in it too. [phone] Reason for Disposition  [1] Caller has URGENT medicine question about med that primary care doctor (or NP/PA) or specialist prescribed AND [2] triager unable to answer question  Answer Assessment - Initial Assessment Questions 1. NAME of MEDICINE: What medicine(s) are you calling about?     Naproxen, methylprednisolone 2. QUESTION: What is your question? (e.g., double dose of medicine, side effect)     Naproxen was flagged by pharmacy due to patient sulfa allergy. She is requesting a different antiinflammatory and also asking if methylprednisolone injection she received has an sulfa components.  3. PRESCRIBER: Who prescribed the medicine? Reason: if prescribed by specialist, call should be referred to that group.     Renee Kuneff 4. SYMPTOMS: Do you have any symptoms? If Yes, ask: What symptoms are you having?  How bad are the symptoms (e.g., mild, moderate, severe)     Denies having any allergic reaction symptoms at this time.  Protocols used: Medication Question Call-A-AH

## 2024-06-06 NOTE — Patient Instructions (Addendum)
 Return in about 3 weeks (around 06/27/2024) for w/ PCP.        Great to see you today.  I have refilled the medication(s) we provide.   If labs were collected or images ordered, we will inform you of  results once we have received them and reviewed. We will contact you either by echart message, or telephone call.  Please give ample time to the testing facility, and our office to run,  receive and review results. Please do not call inquiring of results, even if you can see them in your chart. We will contact you as soon as we are able. If it has been over 1 week since the test was completed, and you have not yet heard from us , then please call us .    - echart message- for normal results that have been seen by the patient already.   - telephone call: abnormal results or if patient has not viewed results in their echart.  If a referral to a specialist was entered for you, please call us  in 2 weeks if you have not heard from the specialist office to schedule.

## 2024-06-06 NOTE — Progress Notes (Signed)
 Have provided doses      Tracey Snow , [birth year], 78 y.o., female MRN: [id number] Patient Care Team    Relationship Specialty Notifications Start End  McGowen, Aleene DEL, MD PCP - General Family Medicine  07/15/22   Vear Charlie LABOR, MD Consulting Physician Neurology  07/13/22   Olegario Messier, MD Consulting Physician Obstetrics and Gynecology  07/13/22   Alvia Norleen BIRCH, MD Consulting Physician Ophthalmology  07/13/22   Ivin Kocher, MD Consulting Physician Dermatology  01/11/23     Chief Complaint  Patient presents with   Facial Swelling    3 weeks; L jaw. Sore to the touch, tooth pain, popping.      Subjective: Tracey Snow is a 78 y.o. Pt presents for an OV with complaints of mild left-sided facial swelling of greater than 3 weeks duration.  Associated symptoms include discomfort-patient points  to left mandible and left TMJ joint. She endorses popping in her left jaw joint.  Discomfort down her neck on the left side.  She reports she does noticed a different taste in her mouth. She was recently evaluated by ENT at the end of August for chronic sinusitis and had a CT scan completed last week.  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  Patient denies fevers, chills, nausea or vomiting.  She states she possibly feels a little bit more fatigued.  She denies pain in her teeth, but feels teeth do not lined up correctly.  She has not seen her dentist routine as of yet for this condition.     01/24/2024    9:58 AM 11/16/2023    1:25 PM 11/22/2022    1:58 PM 10/06/2022   11:02 AM 07/15/2022   10:03 AM  Depression screen PHQ 2/9  Decreased Interest 0 0 1 0 1  Down, Depressed, Hopeless 0 0 0 1 1  PHQ - 2 Score 0 0 1 1 2   Altered sleeping  0 0    Tired, decreased energy  1 0    Change in appetite  0 0    Feeling bad or failure about yourself   0 0    Trouble concentrating  0 0    Moving slowly or fidgety/restless  0 0    Suicidal thoughts  0  0    PHQ-9 Score  1 1    Difficult doing work/chores  Not difficult at all Not difficult at all      Allergies  Allergen Reactions   Sulfonamide Derivatives Rash and Other (See Comments)    DIFFICULTY SWALLOWING   Social History   Social History Narrative   Divorced, 2 children, 3 grandchildren.   She is from [place].   Educ: HS   Disability at one point d/t MS   No tob/alc   Right handed   Caffeine- 1 cup daily   Past Medical History:  Diagnosis Date   Anxiety and depression    Carpal tunnel syndrome, bilateral    Cervical spinal stenosis    Chronic sinusitis    noted on MR imaging for MS   Colon cancer screening    Cologuard NEG 05/07/21 and 04/2024   Ganglion cyst    R wrist   History of basal cell carcinoma (BCC) of skin    multiple sites   History of herpes zoster    back   History of SCC (squamous cell carcinoma) of skin    multiple sites   Hypercholesterolemia  Hypertension    Multiple sclerosis    Dx'd 1998 betaseron helped but caused skin necrosis in arms.  Has been in longterm remission OFF of DMT as of 2023.   Osteoarthritis, multiple sites    c spine and L thumb   Restless legs    Past Surgical History:  Procedure Laterality Date   25 GAUGE PARS PLANA VITRECTOMY WITH 20 GAUGE MVR PORT FOR MACULAR HOLE Left 04/06/2016   Procedure: 25 GAUGE PARS PLANA VITRECTOMY WITH 20 GAUGE MVR PORT FOR MACULAR HOLE;  Surgeon: Norleen JONETTA Ku, MD;  Location: [facility] OR;  Service: Ophthalmology;  Laterality: Left;   CATARACT EXTRACTION Left    COLONOSCOPY     2011   GAS/FLUID EXCHANGE Left 04/06/2016   Procedure: GAS/FLUID EXCHANGE;  Surgeon: Norleen JONETTA Ku, MD;  Location: [facility] OR;  Service: Ophthalmology;  Laterality: Left;   LASER PHOTO ABLATION Left 04/06/2016   Procedure: LASER PHOTO ABLATION;  Surgeon: Norleen JONETTA Ku, MD;  Location: [facility] OR;  Service: Ophthalmology;  Laterality: Left;   MEMBRANE PEEL Left 04/06/2016   Procedure: MEMBRANE PEEL;  Surgeon: Norleen JONETTA Ku, MD;  Location: [facility] OR;  Service: Ophthalmology;  Laterality: Left;   NASAL SINUS SURGERY     PARS PLANA VITRECTOMY W/ REPAIR OF MACULAR HOLE Left 04/06/2016   RETINAL DETACHMENT SURGERY     SERUM PATCH Left 04/06/2016   Procedure: SERUM PATCH;  Surgeon: Norleen JONETTA Ku, MD;  Location: [facility] OR;  Service: Ophthalmology;  Laterality: Left;   SKIN CANCER EXCISION Right 2024   TUBAL LIGATION     Family History  Problem Relation Age of Onset   Throat cancer Mother    Alcoholism Father    Heart disease Father    Stroke Father    Diabetes Sister    Diabetes Brother    Breast cancer Neg Hx    BRCA 1/2 Neg Hx    Allergies as of 06/06/2024       Reactions   Sulfonamide Derivatives Rash, Other (See Comments)   DIFFICULTY SWALLOWING        Medication List        Accurate as of June 06, 2024  3:17 PM. If you have any questions, ask your nurse or doctor.          acetaminophen  325 MG tablet Commonly known as: TYLENOL  Take 650 mg by mouth every 6 (six) hours as needed for mild pain.   ALPRAZolam  0.5 MG tablet Commonly known as: XANAX  TAKE 1 TABLET BY MOUTH THREE TIMES A DAY   amLODipine  5 MG tablet Commonly known as: NORVASC  Take 1 tablet (5 mg total) by mouth daily.   amoxicillin -clavulanate 875-125 MG tablet Commonly known as: AUGMENTIN  Take 1 tablet by mouth 2 (two) times daily. Started by: Arhum Peeples   citalopram  20 MG tablet Commonly known as: CELEXA  TAKE 1 AND 1/2 TABLETS ONCE DAILY   fluticasone  50 MCG/ACT nasal spray Commonly known as: FLONASE  Place 2 sprays into both nostrils daily.   hydrochlorothiazide  25 MG tablet Commonly known as: HYDRODIURIL  Take 0.5 tablets (12.5 mg total) by mouth daily.   levocetirizine 5 MG tablet Commonly known as: Xyzal  Allergy 24HR Take 1 tablet (5 mg total) by mouth every evening.   lisinopril  40 MG tablet Commonly known as: ZESTRIL  Take 0.5 tablets (20 mg total) by mouth daily.   naproxen 250 MG  tablet Commonly known as: NAPROSYN Take 1 tablet (250 mg total) by mouth 2 (two) times daily with a meal  for 7 days. Started by: Charlies Bellini   oxybutynin  5 MG 24 hr tablet Commonly known as: DITROPAN -XL Take 1 tablet (5 mg total) by mouth at bedtime.   potassium chloride  SA 20 MEQ tablet Commonly known as: KLOR-CON  M TAKE 1 TABLET BY MOUTH TWICE A DAY   rosuvastatin  20 MG tablet Commonly known as: CRESTOR  TAKE 1 TABLET BY MOUTH EVERY DAY   tiZANidine  4 MG capsule Commonly known as: ZANAFLEX  TAKE 1 CAPSULE BY MOUTH EVERY DAY   VITAMIN C (CALCIUM  ASCORBATE) PO Take by mouth daily.        All past medical history, surgical history, allergies, family history, immunizations andmedications were updated in the EMR today and reviewed under the history and medication portions of their EMR.     ROS Negative, with the exception of above mentioned in HPI   Objective:  BP 125/78   Pulse 70   Temp 98 F (36.7 C)   Wt 134 lb 9.6 oz (61.1 kg)   SpO2 97%   BMI 23.84 kg/m  Body mass index is 23.84 kg/m. Physical Exam Vitals and nursing note reviewed.  Constitutional:      General: She is not in acute distress.    Appearance: Normal appearance. She is normal weight. She is not ill-appearing or toxic-appearing.  HENT:     Head: Normocephalic and atraumatic.     Comments: Very mild swelling appreciated above left mandible.  No obvious mass or abscess present.  mildly enlarged parotid gland. No teeth pain. Tenderness to palpation of the left TMJ joint. Right mandibular deviation to full extension.  With the popping palpated     Right Ear: Tympanic membrane, ear canal and external ear normal.     Left Ear: Tympanic membrane, ear canal and external ear normal.     Nose: Rhinorrhea present. No congestion.     Mouth/Throat:     Mouth: Mucous membranes are moist.     Pharynx: No oropharyngeal exudate or posterior oropharyngeal erythema.  Eyes:     General: No scleral icterus.        Right eye: No discharge.        Left eye: No discharge.     Extraocular Movements: Extraocular movements intact.     Conjunctiva/sclera: Conjunctivae normal.     Pupils: Pupils are equal, round, and reactive to light.  Cardiovascular:     Rate and Rhythm: Normal rate and regular rhythm.  Musculoskeletal:     Cervical back: Neck supple.  Skin:    Findings: No rash.  Neurological:     Mental Status: She is alert and oriented to person, place, and time. Mental status is at baseline.     Motor: No weakness.     Coordination: Coordination normal.     Gait: Gait normal.  Psychiatric:        Mood and Affect: Mood normal.        Behavior: Behavior normal.        Thought Content: Thought content normal.        Judgment: Judgment normal.      No results found. No results found. No results found for this or any previous visit (from the past 24 hours).  Assessment/Plan: LEEAN AMEZCUA is a 78 y.o. 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  She does have a right mandibular deviation with full extension and popping on exam today.  We discussed TMJ syndrome and suspect this is also part of her cause of lateral neck pain affecting SCM and scalenes. discussed with her I do not feel her TMJ is causing the mild swelling and discomfort she is experiencing inferior to her TMJ joint and suspect she is having 2 separate issues causing her discomfort. Reviewed recent CT by ENT which showed left TMJ arthritis.  No obvious swelling or masses noted over parotid gland or neck. She does not like to take prednisone secondary to side effects.  She is agreeable to Depo-Medrol injection today. Depo-Medrol 80 injection provided today which should help with inflammation of the parotid and her TMJ discomfort. Start naproxen twice daily with food for 7 days, if able to tolerate. She has a prescription for Zanaflex  which she takes  routinely.   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  There is a palpable change here in tissue texture and patient is tender over this area.  She is reporting a odd taste in her mouth which also could be secondary to infection of the parotid gland. Elected to start Augmentin  twice daily She was encouraged to hydrate well  Lemon drops/candies can help by increasing saliva production.  Reviewed expectations re: course of current medical issues. Discussed self-management of symptoms. Outlined signs and symptoms indicating need for more acute intervention. Patient verbalized understanding and all questions were answered. Patient received an After-Visit Summary.  Return in about 3 weeks (around 06/27/2024) for w/ PCP.   No orders of the defined types were placed in this encounter.  Meds ordered this encounter  Medications   amoxicillin -clavulanate (AUGMENTIN ) 875-125 MG tablet    Sig: Take 1 tablet by mouth 2 (two) times daily.    Dispense:  20 tablet    Refill:  0   naproxen (NAPROSYN) 250 MG tablet    Sig: Take 1 tablet (250 mg total) by mouth 2 (two) times daily with a meal for 7 days.    Dispense:  14 tablet    Refill:  0   methylPREDNISolone acetate (DEPO-MEDROL) injection 80 mg   Referral Orders  No referral(s) requested today     Note is dictated utilizing voice recognition software. Although note has been proof read prior to signing, occasional typographical errors still can be missed. If any questions arise, please do not hesitate to call for verification.   electronically signed by:  Charlies Bellini, DO  [facility] - OR

## 2024-06-07 ENCOUNTER — Telehealth: Payer: Self-pay

## 2024-06-07 ENCOUNTER — Other Ambulatory Visit: Payer: Self-pay | Admitting: Family Medicine

## 2024-06-07 NOTE — Telephone Encounter (Signed)
 Source  Jarold Avelina MATSU (Patient)   Subject  Jarold Avelina MATSU (Patient)   Topic  Clinical - Prescription Issue    Communication  Reason for CRM: patient called stating she saw [facility] today and she was prescribed an antiinflammatory and it was flagged by the pharmacy because sulfate is in the drug and she is allergic to it and she want to know if the shot that was given today had sulfate in it too.    [phone]     Patient Information  Patient Name Gender DOB SSN  Myna, Freimark Female 29-Apr-1946 [id number]   Notes  Katrinka Robinson HERO   06/07/2024 10:04 AM  Patient following up on message sent yesterday regarding medication allergies, states she hasn't received a callback regarding a different medication, please reach out.   Annalisia [phone]     Patient was seen by [facility] in PCP absence. Pt was given Augmentin  and Naproxen. Please further meds needs to be changed

## 2024-06-07 NOTE — Telephone Encounter (Signed)
 As far as I know the steroid shot that was given in the office does not have sulfate in it. Ask her if there is an anti-inflammatory that she has been able to take without problem in the past (meloxicam is listed in her old medications but is not clear whether she actually took it or not).

## 2024-06-08 NOTE — Telephone Encounter (Signed)
 Pt states she is not sure if she took the medication or not because she does not know what is in it. But is wanting something different due to the pharmacy flagging the medication for her allergy to sulfate.

## 2024-06-08 NOTE — Telephone Encounter (Signed)
 LVM for pt to return call to further discuss medication concern.

## 2024-06-12 ENCOUNTER — Telehealth: Payer: Self-pay

## 2024-06-12 NOTE — Telephone Encounter (Signed)
 See FYI, ok for transfer

## 2024-06-12 NOTE — Telephone Encounter (Signed)
Ok with transfer 

## 2024-06-12 NOTE — Telephone Encounter (Signed)
Okay for transfer 

## 2024-06-12 NOTE — Telephone Encounter (Signed)
 As long as Dr. McGowen ok with transfer, ok with me

## 2024-06-12 NOTE — Telephone Encounter (Signed)
 Pt is requesting to transfer FROM: [facility]  Pt is requesting to transfer TO: [facility]  Reason for requested transfer: would prefer a woman  It is the responsibility of the team the patient would like to transfer to (Dr. Catherine) to reach out to the patient if for any reason this transfer is not acceptable.    Pt states that she has seen Renee and has an upcoming appt in Nov with her to follow up on previous visit. She really liked her during their visit and would prefer to have her because she is a woman and wants to be clear she she has enjoyed her time with Dr. Candise and there is no hard feelings.

## 2024-06-12 NOTE — Telephone Encounter (Signed)
 Yes this is fine.

## 2024-06-18 DIAGNOSIS — C44729 Squamous cell carcinoma of skin of left lower limb, including hip: Secondary | ICD-10-CM | POA: Diagnosis not present

## 2024-06-27 ENCOUNTER — Ambulatory Visit: Admitting: Family Medicine

## 2024-07-08 ENCOUNTER — Other Ambulatory Visit: Payer: Self-pay | Admitting: Family Medicine

## 2024-07-10 ENCOUNTER — Ambulatory Visit (INDEPENDENT_AMBULATORY_CARE_PROVIDER_SITE_OTHER): Admitting: Family Medicine

## 2024-07-10 ENCOUNTER — Encounter: Payer: Self-pay | Admitting: Family Medicine

## 2024-07-10 VITALS — BP 136/85 | HR 64 | Temp 98.2°F | Ht 63.0 in | Wt 130.0 lb

## 2024-07-10 DIAGNOSIS — M2669 Other specified disorders of temporomandibular joint: Secondary | ICD-10-CM

## 2024-07-10 DIAGNOSIS — M26629 Arthralgia of temporomandibular joint, unspecified side: Secondary | ICD-10-CM | POA: Diagnosis not present

## 2024-07-10 DIAGNOSIS — J0100 Acute maxillary sinusitis, unspecified: Secondary | ICD-10-CM

## 2024-07-10 MED ORDER — AMOXICILLIN-POT CLAVULANATE 875-125 MG PO TABS: 1.0000 | ORAL_TABLET | Freq: Two times a day (BID) | ORAL | 0 refills | Status: AC

## 2024-07-10 NOTE — Progress Notes (Signed)
 OFFICE VISIT  07/10/2024  CC:  Chief Complaint  Patient presents with   Follow-up    Left jaw pain; no improvement; went to dentist after OV, they did XR & root canal 2 weeks ago. Took abx for 13 days, unable to take Meloxicam d/t sulfa allergy    Patient is a 78 y.o. female who presents for left jaw pain. Was seen by Dr. Catherine on 06/06/2024 in my absence. TMJ syndrome/pain and possible parotitis diagnosed, prescribed naproxen  and Augmentin . Patient called back concerned that naproxen  may cause a reaction because the pharmacist said that sulfate was in the drug and she has an allergy to this.    INTERIM HX: Levern improved some when taking her antibiotic but due to ongoing concern she went to her dentist.  Her dentist did an x-ray and felt like she had an infection around a left mandibular molar tooth and he did a root canal.  He did not extract the tooth. Patient has ongoing significant pain in the left TMJ region.  Finds it hard to clench her teeth.  When she eats she chews only with the right side of her mouth.  If she is not chewing or talking she says it does not really hurt. She does have some discomfort in the left maxillary sinus area and has the sensation that it is swollen there. No fever, abnormal weight loss, temporal headache, eye/vision abnormalities, or vertigo.  She does say that at the beginning of all this about 5 weeks ago she did have a couple of days of feeling significantly dizzy.  She denies any past history of TMJ dysfunction/pain.  Denies any other current joint pains.   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  Has been in longterm remission OFF of DMT as of 2023.   Osteoarthritis, multiple sites    c spine and L thumb   Restless legs     Past Surgical History:  Procedure Laterality Date   25 GAUGE PARS PLANA VITRECTOMY WITH 20 GAUGE MVR PORT FOR MACULAR HOLE Left 04/06/2016   Procedure: 25 GAUGE PARS PLANA VITRECTOMY WITH 20 GAUGE MVR PORT FOR MACULAR HOLE;  Surgeon: Norleen JONETTA Ku, MD;  Location: [facility] OR;  Service: Ophthalmology;  Laterality: Left;   CATARACT EXTRACTION Left    COLONOSCOPY     2011   GAS/FLUID EXCHANGE Left 04/06/2016   Procedure: GAS/FLUID EXCHANGE;  Surgeon: Norleen JONETTA Ku, MD;  Location: [facility] OR;  Service: Ophthalmology;  Laterality: Left;   LASER PHOTO ABLATION Left 04/06/2016   Procedure: LASER PHOTO ABLATION;  Surgeon: Norleen JONETTA Ku, MD;  Location: [facility] OR;  Service: Ophthalmology;  Laterality: Left;   MEMBRANE PEEL Left 04/06/2016   Procedure: MEMBRANE PEEL;  Surgeon: Norleen JONETTA Ku, MD;  Location: [facility]  OR;  Service: Ophthalmology;  Laterality: Left;   NASAL SINUS SURGERY     PARS PLANA VITRECTOMY W/ REPAIR OF MACULAR HOLE Left 04/06/2016   RETINAL DETACHMENT SURGERY     SERUM PATCH Left 04/06/2016   Procedure: SERUM PATCH;  Surgeon: Norleen JONETTA Ku, MD;  Location: [facility] OR;  Service: Ophthalmology;  Laterality: Left;   SKIN CANCER EXCISION Right 2024   TUBAL LIGATION      Outpatient Medications Prior to Visit  Medication Sig Dispense Refill   acetaminophen  (TYLENOL ) 325 MG tablet Take 650 mg by mouth every 6 (six) hours as needed for mild pain.     ALPRAZolam  (XANAX ) 0.5 MG tablet TAKE 1 TABLET BY MOUTH THREE TIMES A DAY 90 tablet 3   amLODipine  (NORVASC ) 5 MG tablet Take 1 tablet (5 mg total) by mouth daily. 90 tablet 3   citalopram  (CELEXA ) 20 MG  tablet TAKE 1 AND 1/2 TABLETS ONCE DAILY 135 tablet 3   fluticasone  (FLONASE ) 50 MCG/ACT nasal spray Place 2 sprays into both nostrils daily. 16 g 6   hydrochlorothiazide  (HYDRODIURIL ) 25 MG tablet Take 0.5 tablets (12.5 mg total) by mouth daily. 45 tablet 3   levocetirizine (XYZAL  ALLERGY 24HR) 5 MG tablet Take 1 tablet (5 mg total) by mouth every evening. 30 tablet 3   lisinopril  (ZESTRIL ) 40 MG tablet Take 0.5 tablets (20 mg total) by mouth daily. 45 tablet 3   oxybutynin  (DITROPAN -XL) 5 MG 24 hr tablet Take 1 tablet (5 mg total) by mouth at bedtime. 90 tablet 3   potassium chloride  SA (KLOR-CON  M) 20 MEQ tablet TAKE 1 TABLET BY MOUTH TWICE A DAY 180 tablet 0   rosuvastatin  (CRESTOR ) 20 MG tablet TAKE 1 TABLET BY MOUTH EVERY DAY 90 tablet 0   tiZANidine  (ZANAFLEX ) 4 MG capsule TAKE 1 CAPSULE BY MOUTH EVERY DAY 30 capsule 1   VITAMIN C, CALCIUM  ASCORBATE, PO Take by mouth daily.     amoxicillin -clavulanate (AUGMENTIN ) 875-125 MG tablet Take 1 tablet by mouth 2 (two) times daily. (Patient not taking: Reported on 07/10/2024) 20 tablet 0   No facility-administered medications prior to visit.    Allergies  Allergen Reactions   Sulfonamide Derivatives Rash and Other (See Comments)    DIFFICULTY SWALLOWING    Review of Systems As per HPI  PE:    07/10/2024   10:11 AM 06/06/2024   11:22 AM 06/06/2024   11:17 AM  Vitals with BMI  Height 5' 3    Weight 130 lbs  134 lbs 10 oz  BMI 23.03    Systolic 136 125 875  Diastolic 85 78 80  Pulse 64  70     Physical Exam  General: Alert and well-appearing. Affect is pleasant, thought and speech are lucid. She has mild tenderness to palpation over the left TMJ.  Her main discomfort, though, is with opening and closing her jaw rather than just palpating it.  She has some crepitus with motion of the joint.  No subluxation. I do not detect any swelling over the left maxillary or mandibular region.  I do not detect any parotid gland or  submandibular gland swelling or tenderness.  No neck adenopathy. She is not tender directly over the temples or the forehead. I do not see any swelling or erythema of her gingiva/alveolar ridge.  Buccal mucosa appears normal.  LABS:  Last CBC Lab Results  Component Value Date   WBC 6.7 01/24/2024   HGB 13.2 01/24/2024   HCT 41.4 01/24/2024  MCV 96.3 01/24/2024   MCH 30.7 01/24/2024   RDW 13.6 01/24/2024   PLT 312 01/24/2024   Last metabolic panel Lab Results  Component Value Date   GLUCOSE 80 01/24/2024   NA 140 01/24/2024   K 4.6 01/24/2024   CL 104 01/24/2024   CO2 29 01/24/2024   BUN 17 01/24/2024   CREATININE 0.97 01/24/2024   EGFR 61 01/24/2024   CALCIUM  9.7 01/24/2024   PROT 7.0 03/01/2024   ALBUMIN 4.2 03/01/2024   BILITOT 0.6 03/01/2024   ALKPHOS 81 03/01/2024   AST 22 03/01/2024   ALT 22 03/01/2024   ANIONGAP 6 04/06/2016   CT maxillofacial w/o contrast 05/30/24: IMPRESSION: 1. Mild mucosal disease in the right posterior ethmoid air cells, posterior sphenoid sinuses, and inferior bilateral maxillary sinuses, without air-fluid levels. 2. Mild osteoarthritis of the left temporomandibular joint.  IMPRESSION AND PLAN:  #1 LEFT TMJ pain, unclear etiology. She does have documented left TMJ arthritis noted on maxillofacial CT done by ENT MD 05/30/2024. I do not know if the recent mandibular tooth issue is part of this, nor am I convinced that she has maxillary sinusitis. 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 Since she did show some improvement when on Augmentin  I we will do another 15-day treatment, 875 twice daily. She declines prednisone--> she gets significant insomnia, irritability, tremulousness when taking it. Also, NSAID was prescribed at last visit with Dr. MARLA but it was flagged as a pharmacy as having sulfur in it and patient has a sulfur allergy. We have chosen to simply avoid NSAIDs. Discussed  TMJ strengthening exercises.  An After Visit Summary was printed and given to the patient.  FOLLOW UP: Return in about 2 weeks (around 07/24/2024) for f/u jaw pain.  Signed:  Gerlene Hockey, MD           07/10/2024

## 2024-07-11 ENCOUNTER — Ambulatory Visit: Payer: Self-pay | Admitting: Family Medicine

## 2024-07-11 LAB — SEDIMENTATION RATE: Sed Rate: 6 mm/h (ref 0–30)

## 2024-07-11 LAB — C-REACTIVE PROTEIN: CRP: <3 mg/L (ref <8.0)

## 2024-07-19 ENCOUNTER — Ambulatory Visit (INDEPENDENT_AMBULATORY_CARE_PROVIDER_SITE_OTHER): Admitting: Otolaryngology

## 2024-07-24 ENCOUNTER — Ambulatory Visit: Admitting: Family Medicine

## 2024-07-25 ENCOUNTER — Ambulatory Visit: Admitting: Family Medicine

## 2024-08-05 ENCOUNTER — Other Ambulatory Visit (INDEPENDENT_AMBULATORY_CARE_PROVIDER_SITE_OTHER): Payer: Self-pay | Admitting: Otolaryngology

## 2024-08-10 DIAGNOSIS — S0502XA Injury of conjunctiva and corneal abrasion without foreign body, left eye, initial encounter: Secondary | ICD-10-CM | POA: Diagnosis not present

## 2024-08-10 DIAGNOSIS — H5789 Other specified disorders of eye and adnexa: Secondary | ICD-10-CM | POA: Diagnosis not present

## 2024-09-05 ENCOUNTER — Other Ambulatory Visit: Payer: Self-pay | Admitting: Family Medicine

## 2024-09-06 ENCOUNTER — Other Ambulatory Visit: Payer: Self-pay | Admitting: Family Medicine

## 2024-09-19 ENCOUNTER — Encounter: Admitting: Family Medicine

## 2024-11-21 ENCOUNTER — Encounter

## 2024-12-04 ENCOUNTER — Ambulatory Visit: Admitting: Neurology

## 2024-12-05 ENCOUNTER — Encounter: Admitting: Family Medicine
